# Patient Record
Sex: Male | Born: 1965 | Race: White | Hispanic: No | Marital: Married | Smoking: Never smoker
Health system: Southern US, Community
[De-identification: ages and names within clinical notes are randomized; demographics above are authoritative.]

## PROBLEM LIST (undated history)

## (undated) DIAGNOSIS — T7840XA Allergy, unspecified, initial encounter: Secondary | ICD-10-CM

## (undated) DIAGNOSIS — N200 Calculus of kidney: Secondary | ICD-10-CM

## (undated) HISTORY — PX: APPENDECTOMY: SHX54

## (undated) HISTORY — DX: Allergy, unspecified, initial encounter: T78.40XA

## (undated) HISTORY — PX: HERNIA REPAIR: SHX51

---

## 2006-01-25 ENCOUNTER — Emergency Department (HOSPITAL_COMMUNITY): Admission: EM | Admit: 2006-01-25 | Discharge: 2006-01-26 | Payer: Self-pay | Admitting: Emergency Medicine

## 2006-06-16 ENCOUNTER — Observation Stay (HOSPITAL_COMMUNITY): Admission: EM | Admit: 2006-06-16 | Discharge: 2006-06-17 | Payer: Self-pay | Admitting: Emergency Medicine

## 2006-06-16 ENCOUNTER — Encounter: Admission: RE | Admit: 2006-06-16 | Discharge: 2006-06-16 | Payer: Self-pay | Admitting: Internal Medicine

## 2006-06-16 ENCOUNTER — Encounter (INDEPENDENT_AMBULATORY_CARE_PROVIDER_SITE_OTHER): Payer: Self-pay | Admitting: Specialist

## 2007-12-22 ENCOUNTER — Emergency Department (HOSPITAL_COMMUNITY): Admission: EM | Admit: 2007-12-22 | Discharge: 2007-12-23 | Payer: Self-pay | Admitting: Emergency Medicine

## 2010-06-10 ENCOUNTER — Emergency Department (HOSPITAL_COMMUNITY): Admission: EM | Admit: 2010-06-10 | Discharge: 2010-06-11 | Payer: Self-pay | Admitting: Emergency Medicine

## 2010-11-05 LAB — URINE MICROSCOPIC-ADD ON

## 2010-11-05 LAB — URINALYSIS, ROUTINE W REFLEX MICROSCOPIC
Glucose, UA: NEGATIVE mg/dL
Ketones, ur: NEGATIVE mg/dL
Leukocytes, UA: NEGATIVE
Protein, ur: NEGATIVE mg/dL
Urobilinogen, UA: 1 mg/dL (ref 0.0–1.0)

## 2010-11-05 LAB — URINE CULTURE
Colony Count: 80000
Culture  Setup Time: 201110190810

## 2011-01-09 NOTE — Op Note (Signed)
NAMEABBOTT, JASINSKI             ACCOUNT NO.:  [id number]   MEDICAL RECORD NO.:  [id number]          PATIENT TYPE:  INP   LOCATION:  0098                         FACILITY:  [facility]   PHYSICIAN:  Ollen Gross. Vernell Morgans, M.D. DATE OF BIRTH:  07-25-1966   DATE OF PROCEDURE:  06/16/2006  DATE OF DISCHARGE:  06/17/2006                               OPERATIVE REPORT   PREOPERATIVE DIAGNOSIS:  Appendicitis.   POSTOPERATIVE DIAGNOSIS:  Appendicitis.   PROCEDURE:  Laparoscopic appendectomy.   SURGEON:  Ollen Gross. Carolynne Edouard, M.D.   ANESTHESIA:  General endotracheal.   PROCEDURE:  After informed consent was obtained, the patient was brought  to the operating room and placed in a supine position on the operating  room table.  After adequate induction of general anesthesia, the  patient's abdomen was prepped with Betadine, draped in the usual sterile  manner.  The area below the umbilicus was infiltrated with 0.25%  Marcaine.  A small incision was made with a 15 blade knife.  This  incision was carried down through the subcutaneous tissue bluntly with a  hemostat and [employer] retractors until the linea alba was identified.  The linea alba was incised with a 15 blade knife, and each side was  grasped with Kocher clamps and elevated anteriorly.  The pre-peritoneal  space was then probed bluntly with a hemostat until the peritoneum was  opened and access was gained through the abdominal cavity.  A 0 Vicryl  purse-string stitch was placed on the fascia surrounding the opening.  A  Hasson cannula was placed through the opening __________ previously  placed Vicryl purse-string stitch.  The abdomen was then insufflated  with carbon dioxide without difficulty.  The patient was placed in  Trendelenburg position and rotated slightly right-side up.  The  laparoscope was inserted through the Hasson cannula, and the right lower  quadrant was inspected.  The enlarged, inflamed appendix was easily  identified.  No  other abnormalities were noted on inspecting the  abdomen.  Next, the suprapubic area was infiltrated with 0.25% Marcaine.  A small incision was made with a 15 blade knife and a 10 mm port was  placed bluntly through this incision into the abdominal cavity under  direct vision.  The laparoscope was then moved to the suprapubic porte.  Between the two ports, a site was chosen for a 5 mm port.  This area was  infiltrated with 0.25% Marcaine.  A small stab incision was made with a  15 blade knife, and a 5 mm port was placed bluntly through this incision  into the abdominal cavity under direct vision.  Using a Glassman grasper  and a harmonic scalpel, the appendix was identified, and it was  mobilized by incising some of its retroperitoneal attachments sharply  with the harmonic scalpel.  Once the appendix was able to be elevated,  the mesoappendix was taken down sharply with the harmonic scalpel  without difficulty.  The base of the appendix at its juncture with the  cecum was identified and cleared of any tissue debris.  A laparoscopic  60B stapler with the blue  load was then placed through the Hasson  cannula  and across the base of the appendix, clamped and fired,  __________, dividing the base of the appendix between staple lines.  The  laparoscopic bag was inserted through the Hasson cannula, and the  appendix was placed into the bag, and the bag was sealed.  The abdomen  was then irrigated with copious amounts of saline until the effluent was  clear.  The staple line was examined and found to be completely  hemostatic and healthy.  The appendix in the bag was then removed with  the Hasson cannula through the infraumbilical port without difficulty.  The fascial defect was closed with the previously placed Vicryl purse-  string stitch as well as with another figure-of-eight 0 Vicryl stitch.  The rest of the ports were removed under direct vision.  The gas was  allowed to escape.  The  fascia of the suprapubic port was also closed  under direct vision with interrupted 0 Vicryl stitch.  The skin was all  closed with interrupted 4-0  Monocryl subcuticular stitches.  Benzoin, Steri-Strips, and sterile  dressings were applied.  The patient tolerated the procedure well.  At  the end of the case, all needle, sponge, instrument counts were correct.  Patient was then awakened and taken to recovery in stable condition.      Ollen Gross. Vernell Morgans, M.D.  Electronically Signed     PST/MEDQ  D:  06/17/2006  T:  06/18/2006  Job:  [id number]

## 2011-01-09 NOTE — H&P (Signed)
NAMEIRIS, TATSCH             ACCOUNT NO.:  [id number]   MEDICAL RECORD NO.:  [id number]          PATIENT TYPE:  INP   LOCATION:  0098                         FACILITY:  [facility]   PHYSICIAN:  Ollen Gross. Vernell Morgans, M.D. DATE OF BIRTH:  1966/01/21   DATE OF ADMISSION:  06/16/2006  DATE OF DISCHARGE:  06/17/2006                              HISTORY & PHYSICAL   Mr. Crumby is a 45 year old white male who presents today to the  emergency department with right lower quadrant pain that started  yesterday.  He has had some fevers and chills with this.  He has had no  nausea and vomiting.  No chest pain or shortness of breath.  The pain  started diffusely and moved to his right lower quadrant.  His other  review of systems are unremarkable.   PAST MEDICAL HISTORY:  Significant for kidney stones.   PAST SURGICAL HISTORY:  Significant for kidney stone removal and a cyst  removed from his mouth as a child.   MEDICATIONS:  None.   ALLERGIES:  No known drug allergies.   SOCIAL HISTORY:  He denies the use of alcohol or tobacco products.   FAMILY HISTORY:  Noncontributory.   PHYSICAL EXAMINATION:  VITAL SIGNS:  Temperature 97.2, blood pressure 154/99, pulse 82.  GENERAL:  Well developed, well nourished white male in no acute  distress.  SKIN:  Warm and dry, no jaundice.  HEENT:  Extraocular movements intact, pupils equal, round, reactive to  light, sclerae nonicteric.  LUNGS:  Clear bilaterally with no use of accessory respiratory muscles.  HEART:  Regular rate and rhythm with an impulse in the left chest.  ABDOMEN:  Soft with focal right lower quadrant tenderness with some  guarding but no peritonitis.  EXTREMITIES:  No cyanosis, clubbing, and edema with good strength in his  arms and legs.  PSYCHOLOGICAL:  Alert and oriented x 3 with no evidence of anxiety or  depression.   LABORATORY DATA:  On review of his lab work, it was significant for a  normal white count.  On reviewing his CT  scan with the radiologist, this  did show an enlarged inflamed appendix.   ASSESSMENT/PLAN:  This is a 45 year old white male with what appears to  be acute appendicitis.  Because of the risk of rupture and sepsis, I  think he needs to have his appendix removed today.  I have discussed  with him in  detail the risks and benefits of the operation to remove the appendix as  well as some of the technical aspects and he understands and wishes to  proceed.  We will obtain some routine preoperative lab work in  preparation in doing this for him this evening.      Ollen Gross. Vernell Morgans, M.D.  Electronically Signed     PST/MEDQ  D:  06/16/2006  T:  06/17/2006  Job:  [id number]

## 2011-04-27 ENCOUNTER — Emergency Department (HOSPITAL_COMMUNITY): Payer: BC Managed Care – PPO

## 2011-04-27 ENCOUNTER — Emergency Department (HOSPITAL_COMMUNITY)
Admission: EM | Admit: 2011-04-27 | Discharge: 2011-04-27 | Disposition: A | Discharge status: Home / Self Care | Payer: BC Managed Care – PPO | Attending: Emergency Medicine | Admitting: Emergency Medicine

## 2011-04-27 DIAGNOSIS — R109 Unspecified abdominal pain: Secondary | ICD-10-CM | POA: Insufficient documentation

## 2011-04-27 DIAGNOSIS — R112 Nausea with vomiting, unspecified: Secondary | ICD-10-CM | POA: Insufficient documentation

## 2011-04-27 DIAGNOSIS — Z87442 Personal history of urinary calculi: Secondary | ICD-10-CM | POA: Insufficient documentation

## 2011-04-27 DIAGNOSIS — N200 Calculus of kidney: Secondary | ICD-10-CM | POA: Insufficient documentation

## 2011-04-27 LAB — URINALYSIS, ROUTINE W REFLEX MICROSCOPIC
Leukocytes, UA: NEGATIVE
Nitrite: NEGATIVE
Specific Gravity, Urine: 1.021 (ref 1.005–1.030)
Urobilinogen, UA: 0.2 mg/dL (ref 0.0–1.0)
pH: 6 (ref 5.0–8.0)

## 2011-04-27 LAB — URINE MICROSCOPIC-ADD ON

## 2011-04-27 LAB — CBC
MCHC: 34.6 g/dL (ref 30.0–36.0)
RDW: 12.8 % (ref 11.5–15.5)
WBC: 11.7 10*3/uL — ABNORMAL HIGH (ref 4.0–10.5)

## 2011-04-27 LAB — DIFFERENTIAL
Basophils Absolute: 0 10*3/uL (ref 0.0–0.1)
Basophils Relative: 0 % (ref 0–1)
Eosinophils Relative: 0 % (ref 0–5)
Lymphocytes Relative: 9 % — ABNORMAL LOW (ref 12–46)
Monocytes Absolute: 0.9 10*3/uL (ref 0.1–1.0)
Neutro Abs: 9.6 10*3/uL — ABNORMAL HIGH (ref 1.7–7.7)

## 2011-04-27 LAB — BASIC METABOLIC PANEL
Chloride: 100 mEq/L (ref 96–112)
GFR calc Af Amer: >60 mL/min (ref >60)
GFR calc non Af Amer: >60 mL/min (ref >60)
Potassium: 4 mEq/L (ref 3.5–5.1)
Sodium: 138 mEq/L (ref 135–145)

## 2012-03-23 IMAGING — CT CT ABD-PELV W/O CM
1 of 2 series · 15 of 32 positions shown, 19 images · non-contrast
Comparison: 06/11/2010

CLINICAL DATA: Right flank pain.  Nausea.  History of stones.
History of appendectomy.

CT ABDOMEN AND PELVIS WITHOUT CONTRAST
TECHNIQUE: Multidetector CT imaging of the abdomen and pelvis was
performed following the standard protocol without intravenous
contrast.

[Series 2: abd/pel w/o · axial · non-contrast · 0.77mm/px · z∈[+836,+1166]mm · 15 of 75 slices shown, 19 images]
[im 6/75  soft-tissue]
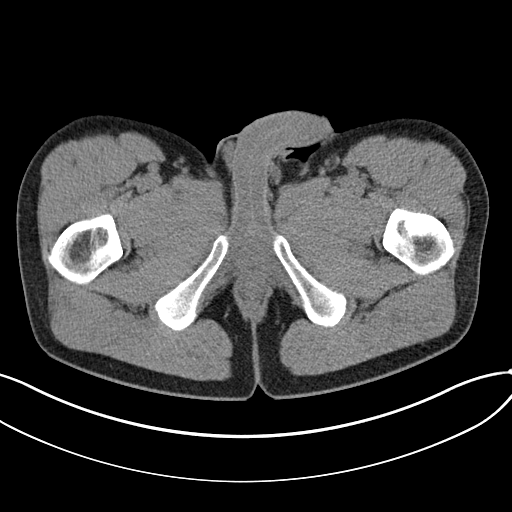
[im 6/75  bone]
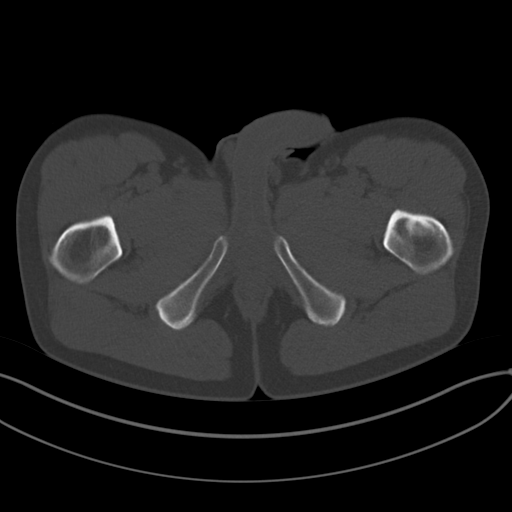
[im 11/75  soft-tissue]
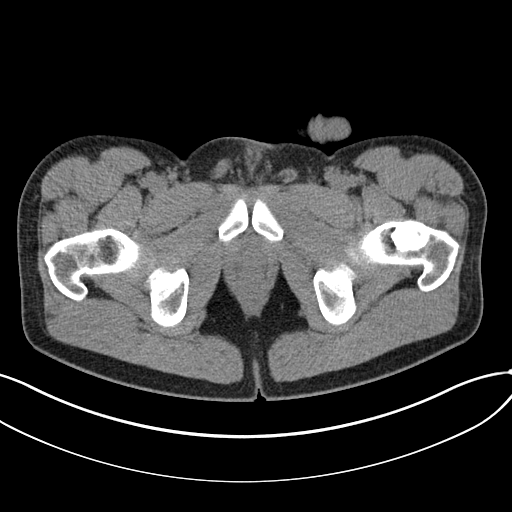
[im 17/75  soft-tissue]
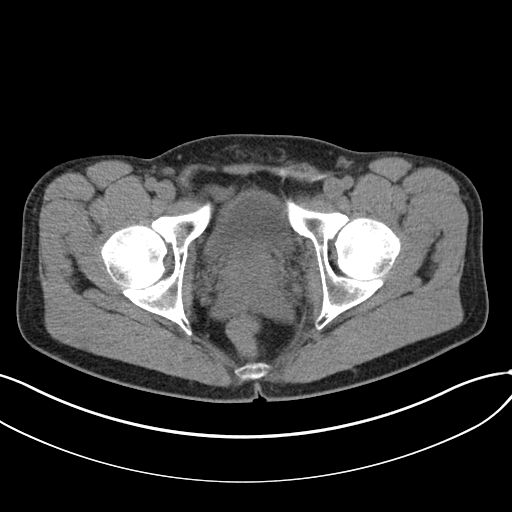
[im 22/75  soft-tissue]
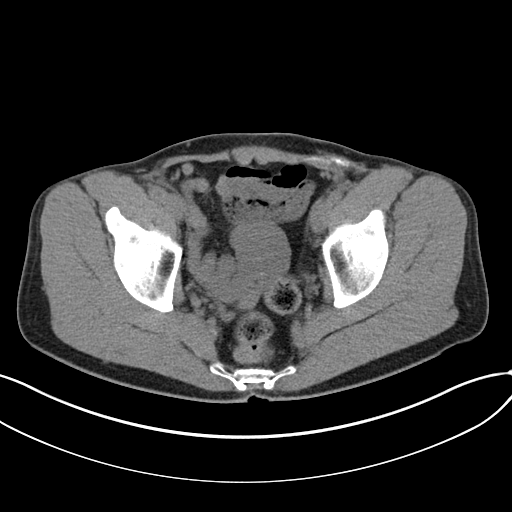
[im 28/75  soft-tissue]
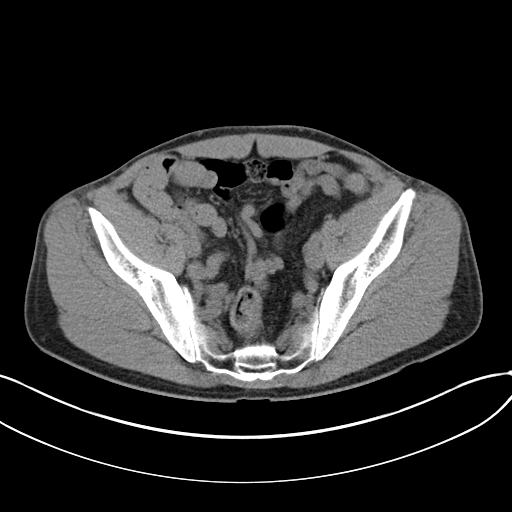
[im 33/75  soft-tissue]
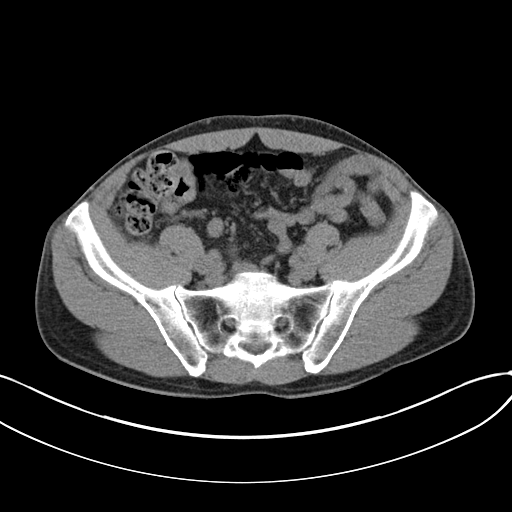
[im 39/75  soft-tissue]
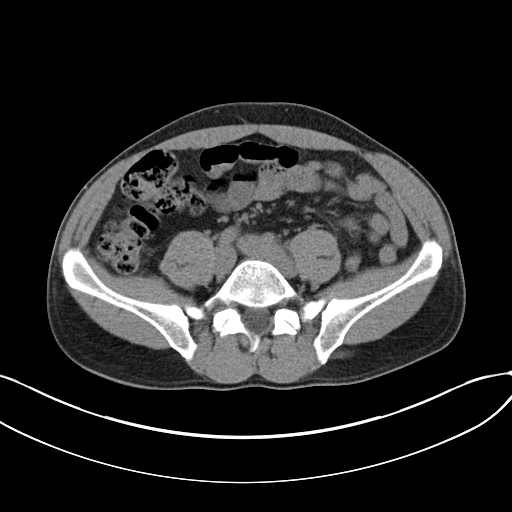
[im 44/75  soft-tissue]
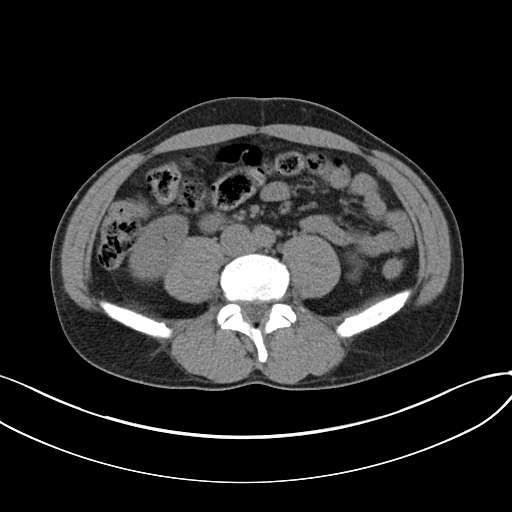
[im 50/75  soft-tissue]
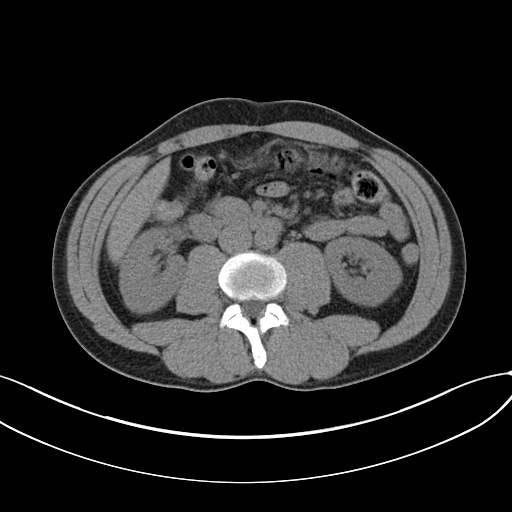
[im 50/75  bone]
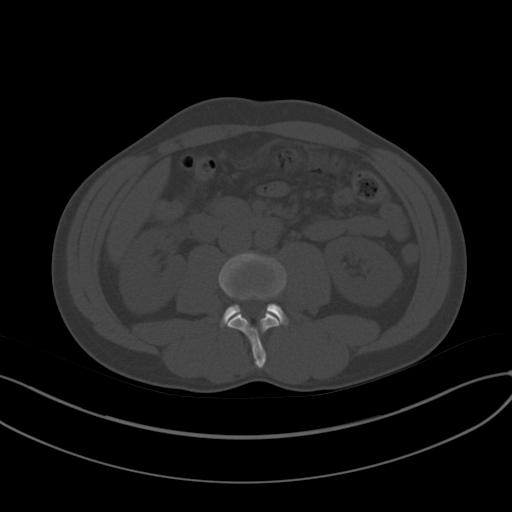
[im 55/75  soft-tissue]
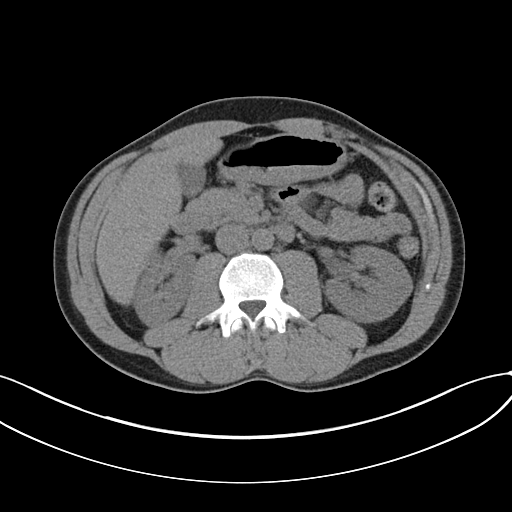
[im 61/75  soft-tissue]
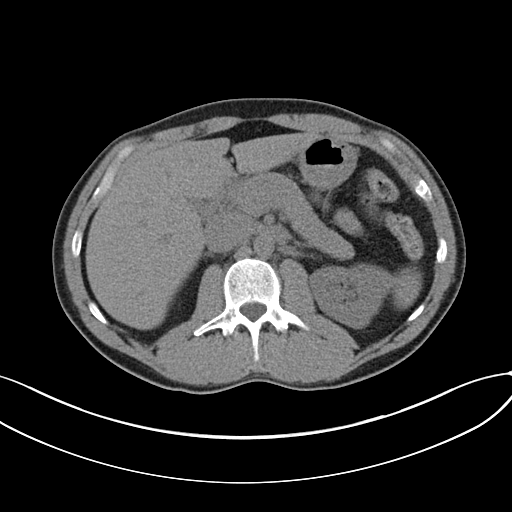
[im 64/75  lung]
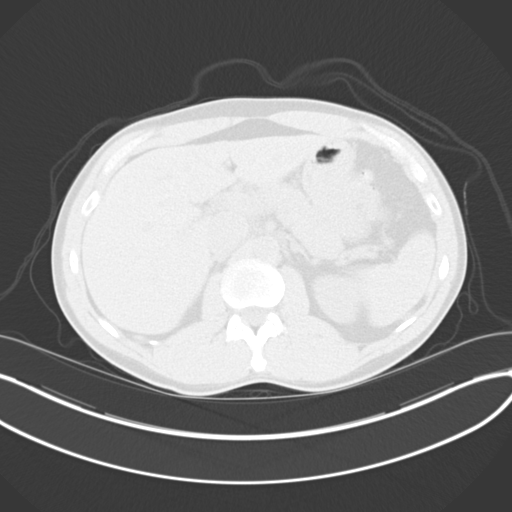
[im 66/75  soft-tissue]
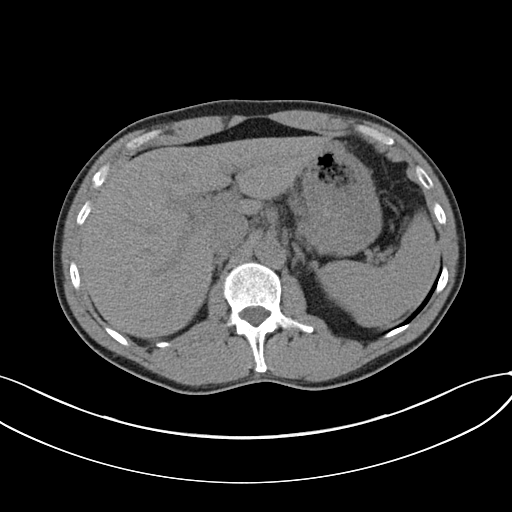
[im 66/75  lung]
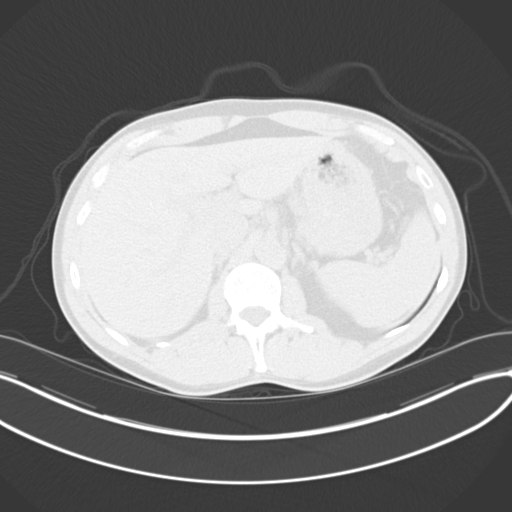
[im 69/75  lung]
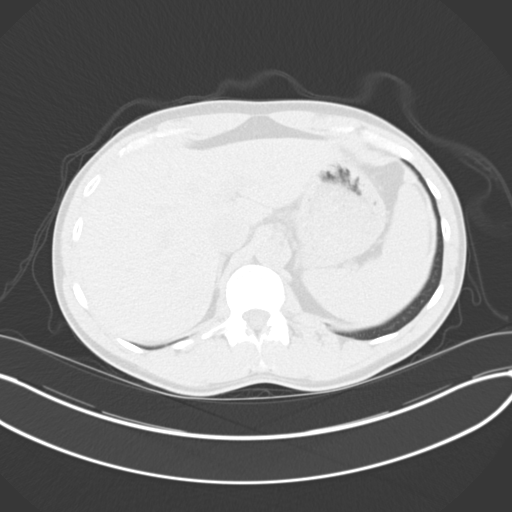
[im 72/75  soft-tissue]
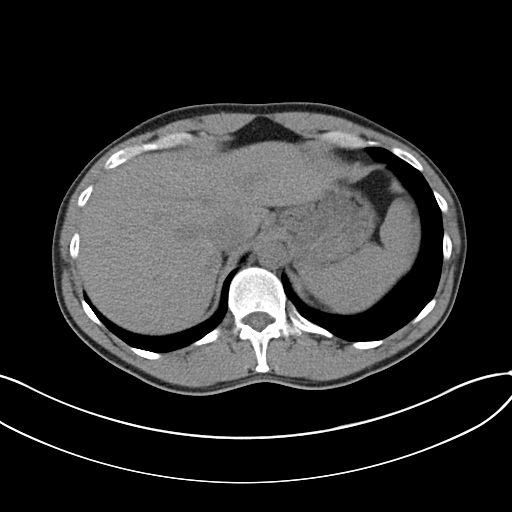
[im 72/75  lung]
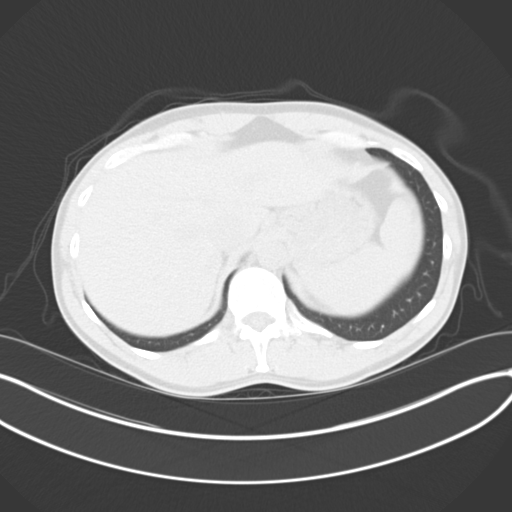

[15 of 32 positions shown; findings below may reference images not displayed]

FINDINGS: Lung bases are unremarkable.  There are intrarenal
calculi on the right, measuring one - 4 mm in diameter.  No
evidence for ureteral stones however.  No intrarenal or ureteral
stones are identified in the left kidney.  No focal abnormality
identified within the visualized portions of the liver, spleen,
pancreas, adrenal glands.  The gallbladder is present.  There are
scattered colonic diverticula.  No CT evidence for acute
diverticulitis.  Patient has had prior appendectomy.  No evidence
for bowel obstruction or bowel wall thickening.

The urinary bladder and distal urinary tract have a normal
appearance.  No free pelvic fluid or pelvic adenopathy. No evidence
for aortic aneurysm. There is stable sclerosis within the left
femoral neck, consistent with benign process.
IMPRESSION: 1.  Multiple intrarenal calculi on the right.
2.  No evidence for ureteral calculi or other ureteral obstruction.
3.  Diverticulosis without evidence for acute diverticulitis.

## 2013-01-06 ENCOUNTER — Ambulatory Visit (INDEPENDENT_AMBULATORY_CARE_PROVIDER_SITE_OTHER): Payer: BC Managed Care – PPO | Admitting: Emergency Medicine

## 2013-01-06 VITALS — BP 140/90 | HR 62 | Temp 98.7°F | Resp 16 | Ht 67.0 in | Wt 155.0 lb

## 2013-01-06 DIAGNOSIS — J020 Streptococcal pharyngitis: Secondary | ICD-10-CM

## 2013-01-06 DIAGNOSIS — A088 Other specified intestinal infections: Secondary | ICD-10-CM

## 2013-01-06 DIAGNOSIS — J029 Acute pharyngitis, unspecified: Secondary | ICD-10-CM

## 2013-01-06 DIAGNOSIS — R197 Diarrhea, unspecified: Secondary | ICD-10-CM

## 2013-01-06 MED ORDER — CIPROFLOXACIN HCL 500 MG PO TABS: 500.0000 mg | ORAL_TABLET | Freq: Two times a day (BID) | ORAL | Status: DC

## 2013-01-06 MED ORDER — ONDANSETRON 8 MG PO TBDP: 8.0000 mg | ORAL_TABLET | Freq: Three times a day (TID) | ORAL | Status: DC | PRN

## 2013-01-06 NOTE — Progress Notes (Addendum)
[facility] [address] [place] [address] [phone]  Date:  01/06/2013   Name:  Christian Rivas   DOB:  19-Jan-1966   MRN:  [id number]  PCP:  Jacklynn Barnacle, NP    Chief Complaint: Nausea and Diarrhea   History of Present Illness:  Christian Rivas is a 47 y.o. very pleasant male patient who presents with the following:  Traveled to [place] for 10 days and returned home a week ago. Did not follow advice to avoid drinking local water and had diarrhea, nausea and vomiting.  Moderate abdominal cramping.  Symptoms passed and was able to eat solid food until Wednesday when he again developed diarrhea.  Says two of his travelling companions were treated for "parasitosis".  No fever or chills.  The patient has no complaint of blood, mucous, or pus in her stools. Has a sore throat and clear nasal drainage started today. No improvement with over the counter medications or other home remedies. Denies other complaint or health concern today.   There are no active problems to display for this patient.   Past Medical History  Diagnosis Date  . Allergy     Past Surgical History  Procedure Laterality Date  . Appendectomy    . Hernia repair      History  Substance Use Topics  . Smoking status: Never Smoker   . Smokeless tobacco: Not on file  . Alcohol Use: Yes    History reviewed. No pertinent family history.  No Known Allergies  Medication list has been reviewed and updated.  No current outpatient prescriptions on file prior to visit.   No current facility-administered medications on file prior to visit.    Review of Systems:  As per HPI, otherwise negative.      Physical Examination: Filed Vitals:   01/06/13 1015  BP: 140/90  Pulse: 62  Temp: 98.7 F (37.1 C)  Resp: 16   Filed Vitals:   01/06/13 1015  Height: 5\' 7"  (1.702 m)  Weight: 155 lb (70.308 kg)   Body mass index is 24.27 kg/(m^2). Ideal Body Weight: Weight in (lb) to  have BMI = 25: 159.3  GEN: WDWN, NAD, Non-toxic, A & O x 3 HEENT: Atraumatic, Normocephalic. Neck supple. No masses, No LAD. Ears and Nose: No external deformity. CV: RRR, No M/G/R. No JVD. No thrill. No extra heart sounds. PULM: CTA B, no wheezes, crackles, rhonchi. No retractions. No resp. distress. No accessory muscle use. ABD: S, NT, ND, +BS. No rebound. No HSM. EXTR: No c/c/e NEURO Normal gait.  PSYCH: Normally interactive. Conversant. Not depressed or anxious appearing.  Calm demeanor.    Assessment and Plan: "travellers diarrhea" vs giardia Pharyngitis Labs   Signed,  Phillips Odor, MD   Results for orders placed in visit on 01/06/13  POCT RAPID STREP A (OFFICE)      Result Value Range   Rapid Strep A Screen Negative  Negative

## 2013-01-06 NOTE — Patient Instructions (Addendum)
Clear Liquid Diet The clear liquid dietconsists of foods that are liquid or will become liquid at room temperature.You should be able to see through the liquid and beverages. Examples of foods allowed on a clear liquid diet include fruit juice, broth or bouillon, gelatin, or frozen ice pops. The purpose of this diet is to provide necessary fluid, electrolytes such as sodium and potassium, and energy to keep the body functioning during times when you are not able to consume a regular diet.A clear liquid diet should not be continued for long periods of time as it is not nutritionally adequate.  REASONS FOR USING A CLEAR LIQUID DIET  In sudden onset (acute) conditions for a patient before or after surgery.  As the first step in oral feeding.  For fluid and electrolyte replacement in diarrheal diseases.  As a diet before certain medical tests are performed. ADEQUACY The clear liquid diet is adequate only in ascorbic acid, according to the Recommended Dietary Allowances of the [employer]. CHOOSING FOODS Breads and Starches  Allowed:  None are allowed.  Avoid: All are avoided. Vegetables  Allowed:  Strained tomato or vegetable juice.  Avoid: Any others. Fruit  Allowed:  Strained fruit juices and fruit drinks. Include 1 serving of citrus or vitamin C-enriched fruit juice daily.  Avoid: Any others. Meat and Meat Substitutes  Allowed:  None are allowed.  Avoid: All are avoided. Milk  Allowed:  None are allowed.  Avoid: All are avoided. Soups and Combination Foods  Allowed:  Clear bouillon, broth, or strained broth-based soups.  Avoid: Any others. Desserts and Sweets  Allowed:  Sugar, honey. High protein gelatin. Flavored gelatin, ices, or frozen ice pops that do not contain milk.  Avoid: Any others. Fats and Oils  Allowed:  None are allowed.  Avoid: All are avoided. Beverages  Allowed: Cereal beverages, coffee (regular or decaffeinated), tea, or soda  at the discretion of your caregiver.  Avoid: Any others. Condiments  Allowed:  Iodized salt.  Avoid: Any others, including pepper. Supplements  Allowed:  Liquid nutrition beverages.  Avoid: Any others that contain lactose or fiber. SAMPLE MEAL PLAN Breakfast  4 oz (120 mL) strained orange juice.   to 1 cup (125 to 250 mL) gelatin (plain or fortified).  1 cup (250 mL) beverage (coffee or tea).  Sugar, if desired. Midmorning Snack   cup (125 mL) gelatin (plain or fortified). Lunch  1 cup (250 mL) broth or consomm.  4 oz (120 mL) strained grapefruit juice.   cup (125 mL) gelatin (plain or fortified).  1 cup (250 mL) beverage (coffee or tea).  Sugar, if desired. Midafternoon Snack   cup (125 mL) fruit ice.   cup (125 mL) strained fruit juice. Dinner  1 cup (250 mL) broth or consomm.   cup (125 mL) cranberry juice.   cup (125 mL) flavored gelatin (plain or fortified).  1 cup (250 mL) beverage (coffee or tea).  Sugar, if desired. Evening Snack  4 oz (120 mL) strained apple juice (vitamin C-fortified).   cup (125 mL) flavored gelatin (plain or fortified). Document Released: 08/10/2005 Document Revised: 11/02/2011 Document Reviewed: 11/07/2010 [facility] Patient Information 2013 [place], [place]. Giardiasis Giardiasis is an infection of the small intestine with the parasite Giardia intestinalis. Giardia intestinalis cannot be seen with the naked eye. It is often found in unclean (contaminated) water.  CAUSES  Infection can be caused by drinking contaminated water. Giardia intestinalis can also be found in some tap water. SYMPTOMS  An infection  causes:  Explosive, foul smelling, watery diarrhea.  A Feeling of sickness in your stomach (nausea).  Abdominal cramps and pain. It takes about 1 to 2 weeks after ingesting infected water or food to get sick. The illness usually lasts 2 to 4 weeks. Infection in infants and children can be long  lasting. DIAGNOSIS  It can be diagnosed by stool exam. Blood tests may be needed. TREATMENT  Medications can be given to shorten the course of the illness. HOME CARE INSTRUCTIONS   In areas of contamination, boil your water if possible. Filtering tap water in areas of contamination removes most Giardia. Cysts of Giardia Intestinalis are resistant to chlorine.  Be careful handling soiled undergarments and diapers. If infection is present, it is easily passed by hand to mouth. Use good hand-washing techniques.  Follow up with your caregiver as directed. SEEK MEDICAL CARE IF:  You do not get better. Document Released: 08/07/2000 Document Revised: 11/02/2011 Document Reviewed: 03/29/2008 [facility] Patient Information 2013 [place], [place]. Diarrhea Infections caused by germs (bacterial) or a virus commonly cause diarrhea. Your caregiver has determined that with time, rest and fluids, the diarrhea should improve. In general, eat normally while drinking more water than usual. Although water may prevent dehydration, it does not contain salt and minerals (electrolytes). Broths, weak tea without caffeine and oral rehydration solutions (ORS) replace fluids and electrolytes. Small amounts of fluids should be taken frequently. Large amounts at one time may not be tolerated. Plain water may be harmful in infants and the elderly. Oral rehydrating solutions (ORS) are available at pharmacies and grocery stores. ORS replace water and important electrolytes in proper proportions. Sports drinks are not as effective as ORS and may be harmful due to sugars worsening diarrhea.  ORS is especially recommended for use in children with diarrhea. As a general guideline for children, replace any new fluid losses from diarrhea and/or vomiting with ORS as follows:  If your child weighs 22 pounds or under (10 kg or less), give 60-120 mL ( -  cup or 2 - 4 ounces) of ORS for each episode of diarrheal stool or vomiting  episode.  If your child weighs more than 22 pounds (more than 10 kgs), give 120-240 mL ( - 1 cup or 4 - 8 ounces) of ORS for each diarrheal stool or episode of vomiting.  While correcting for dehydration, children should eat normally. However, foods high in sugar should be avoided because this may worsen diarrhea. Large amounts of carbonated soft drinks, juice, gelatin desserts and other highly sugared drinks should be avoided.  After correction of dehydration, other liquids that are appealing to the child may be added. Children should drink small amounts of fluids frequently and fluids should be increased as tolerated. Children should drink enough fluids to keep urine clear or pale yellow.  Adults should eat normally while drinking more fluids than usual. Drink small amounts of fluids frequently and increase as tolerated. Drink enough fluids to keep urine clear or pale yellow. Broths, weak decaffeinated tea, lemon lime soft drinks (allowed to go flat) and ORS replace fluids and electrolytes.  Avoid:  Carbonated drinks.  Juice.  Extremely hot or cold fluids.  Caffeine drinks.  Fatty, greasy foods.  Alcohol.  Tobacco.  Too much intake of anything at one time.  Gelatin desserts.  Probiotics are active cultures of beneficial bacteria. They may lessen the amount and number of diarrheal stools in adults. Probiotics can be found in yogurt with active cultures and in supplements.  Wash hands well to avoid spreading bacteria and virus.  Anti-diarrheal medications are not recommended for infants and children.  Only take over-the-counter or prescription medicines for pain, discomfort or fever as directed by your caregiver. Do not give aspirin to children because it may cause Reye's Syndrome.  For adults, ask your caregiver if you should continue all prescribed and over-the-counter medicines.  If your caregiver has given you a follow-up appointment, it is very important to keep that  appointment. Not keeping the appointment could result in a chronic or permanent injury, and disability. If there is any problem keeping the appointment, you must call back to this facility for assistance. SEEK IMMEDIATE MEDICAL CARE IF:   You or your child is unable to keep fluids down or other symptoms or problems become worse in spite of treatment.  Vomiting or diarrhea develops and becomes persistent.  There is vomiting of blood or bile (green material).  There is blood in the stool or the stools are black and tarry.  There is no urine output in 6-8 hours or there is only a small amount of very dark urine.  Abdominal pain develops, increases or localizes.  You have a fever.  Your baby is older than 3 months with a rectal temperature of 102 F (38.9 C) or higher.  Your baby is 63 months old or younger with a rectal temperature of 100.4 F (38 C) or higher.  You or your child develops excessive weakness, dizziness, fainting or extreme thirst.  You or your child develops a rash, stiff neck, severe headache or become irritable or sleepy and difficult to awaken. MAKE SURE YOU:   Understand these instructions.  Will watch your condition.  Will get help right away if you are not doing well or get worse. Document Released: 07/31/2002 Document Revised: 11/02/2011 Document Reviewed: 06/17/2009 [facility] Patient Information 2013 [place], [place].

## 2013-01-11 LAB — OVA AND PARASITE SCREEN: OP: NONE SEEN

## 2013-01-13 LAB — STOOL CULTURE

## 2017-05-17 ENCOUNTER — Emergency Department (HOSPITAL_COMMUNITY): Admission: EM | Admit: 2017-05-17 | Discharge: 2017-05-17 | Discharge status: Against Medical Advice | Payer: Self-pay

## 2017-05-17 ENCOUNTER — Emergency Department (HOSPITAL_BASED_OUTPATIENT_CLINIC_OR_DEPARTMENT_OTHER): Payer: 59

## 2017-05-17 ENCOUNTER — Encounter (HOSPITAL_BASED_OUTPATIENT_CLINIC_OR_DEPARTMENT_OTHER): Payer: Self-pay | Admitting: Emergency Medicine

## 2017-05-17 ENCOUNTER — Emergency Department (HOSPITAL_BASED_OUTPATIENT_CLINIC_OR_DEPARTMENT_OTHER)
Admission: EM | Admit: 2017-05-17 | Discharge: 2017-05-17 | Disposition: A | Discharge status: Home / Self Care | Payer: 59 | Attending: Emergency Medicine | Admitting: Emergency Medicine

## 2017-05-17 DIAGNOSIS — R1031 Right lower quadrant pain: Secondary | ICD-10-CM | POA: Diagnosis present

## 2017-05-17 DIAGNOSIS — N23 Unspecified renal colic: Secondary | ICD-10-CM | POA: Diagnosis not present

## 2017-05-17 DIAGNOSIS — N201 Calculus of ureter: Secondary | ICD-10-CM | POA: Diagnosis not present

## 2017-05-17 DIAGNOSIS — R109 Unspecified abdominal pain: Secondary | ICD-10-CM

## 2017-05-17 HISTORY — DX: Calculus of kidney: N20.0

## 2017-05-17 LAB — URINALYSIS, MICROSCOPIC (REFLEX): Squamous Epithelial / LPF: NONE SEEN

## 2017-05-17 LAB — URINALYSIS, ROUTINE W REFLEX MICROSCOPIC
Bilirubin Urine: NEGATIVE
Glucose, UA: NEGATIVE mg/dL
KETONES UR: NEGATIVE mg/dL
LEUKOCYTES UA: NEGATIVE
Nitrite: NEGATIVE
PH: 7.5 (ref 5.0–8.0)
Protein, ur: 30 mg/dL — AB
SPECIFIC GRAVITY, URINE: 1.01 (ref 1.005–1.030)

## 2017-05-17 MED ORDER — HYDROMORPHONE HCL 1 MG/ML IJ SOLN
1.0000 mg | Freq: Once | INTRAMUSCULAR | Status: AC
  Administered 2017-05-17: 1 mg via INTRAVENOUS
  Filled 2017-05-17: qty 1

## 2017-05-17 MED ORDER — ONDANSETRON HCL 4 MG/2ML IJ SOLN
4.0000 mg | Freq: Once | INTRAMUSCULAR | Status: AC
  Administered 2017-05-17: 4 mg via INTRAVENOUS
  Filled 2017-05-17: qty 2

## 2017-05-17 MED ORDER — OXYCODONE-ACETAMINOPHEN 5-325 MG PO TABS
1.0000 | ORAL_TABLET | ORAL | Status: DC | PRN
  Administered 2017-05-17: 1 via ORAL
  Filled 2017-05-17: qty 1

## 2017-05-17 MED ORDER — KETOROLAC TROMETHAMINE 30 MG/ML IJ SOLN
30.0000 mg | Freq: Once | INTRAMUSCULAR | Status: AC
  Administered 2017-05-17: 30 mg via INTRAVENOUS
  Filled 2017-05-17: qty 1

## 2017-05-17 MED ORDER — PROMETHAZINE HCL 25 MG PO TABS: 25.0000 mg | ORAL_TABLET | Freq: Three times a day (TID) | ORAL | 0 refills | Status: DC | PRN

## 2017-05-17 MED ORDER — OXYCODONE-ACETAMINOPHEN 5-325 MG PO TABS: 1.0000 | ORAL_TABLET | ORAL | 0 refills | Status: DC | PRN

## 2017-05-17 NOTE — ED Notes (Signed)
Pt. Reports he is feeling better and ready to go home if possible

## 2017-05-17 NOTE — ED Notes (Signed)
LEFT WITHOUT BEING SEEN.

## 2017-05-17 NOTE — Discharge Instructions (Signed)
Return here as needed.  Follow-up with [facility]

## 2017-05-17 NOTE — ED Triage Notes (Signed)
Right sided flank pain since 1130 am.  Hx of kidney stones.  Pt took ibuprofen and home and his last flomax that he had.  Pt states pain has been waxing and waning from 10 down to 7.

## 2017-05-17 NOTE — ED Notes (Signed)
ED Provider at bedside. 

## 2017-05-21 NOTE — ED Provider Notes (Signed)
[facility]-EMERGENCY DEPT Provider Note   CSN: [id number] Arrival date & time: 05/17/17  1751     History   Chief Complaint Chief Complaint  Patient presents with  . Flank Pain    HPI Christian Rivas is a 51 y.o. male.  HPI Patient presents to the emergency department with right-sided flank pain that started this morning around 11:30.  The patient states he does have a history of kidney stones.  He states he took 3 ibuprofen with some relief of his symptoms, but states the pain came back pretty significantly, and that is what brought him to the emergency department.  The patient states that this feels similar to previous episodes of kidney stoneThe patient denies chest pain, shortness of breath, headache,blurred vision, neck pain, fever, cough, weakness, numbness, dizziness, anorexia, edema,  vomiting, diarrhea, rash, back pain, dysuria, hematemesis, bloody stool, near syncope, or syncope. Past Medical History:  Diagnosis Date  . Allergy   . Kidney stones     There are no active problems to display for this patient.   Past Surgical History:  Procedure Laterality Date  . APPENDECTOMY    . HERNIA REPAIR         Home Medications    Prior to Admission medications   Medication Sig Start Date End Date Taking? Authorizing Provider  oxyCODONE-acetaminophen (PERCOCET/ROXICET) 5-325 MG tablet Take 1-2 tablets by mouth every 4 (four) hours as needed for severe pain. 05/17/17   Stephene Alegria, Cristal [place], PA-C  promethazine (PHENERGAN) 25 MG tablet Take 1 tablet (25 mg total) by mouth every 8 (eight) hours as needed for nausea or vomiting. 05/17/17   Charlestine Night, PA-C    Family History No family history on file.  Social History Social History  Substance Use Topics  . Smoking status: Never Smoker  . Smokeless tobacco: Never Used  . Alcohol use Yes     Allergies   Patient has no known allergies.   Review of Systems Review of Systems All other systems negative except as  documented in the HPI. All pertinent positives and negatives as reviewed in the HPI. Physical Exam Updated Vital Signs BP 127/79 (BP Location: Left Arm)   Pulse 65   Temp 98.2 F (36.8 C) (Oral)   Resp 16   SpO2 98%   Physical Exam  Constitutional: He is oriented to person, place, and time. He appears well-developed and well-nourished. No distress.  HENT:  Head: Normocephalic and atraumatic.  Mouth/Throat: Oropharynx is clear and moist.  Eyes: Pupils are equal, round, and reactive to light.  Neck: Normal range of motion. Neck supple.  Cardiovascular: Normal rate, regular rhythm and normal heart sounds.  Exam reveals no gallop and no friction rub.   No murmur heard. Pulmonary/Chest: Effort normal and breath sounds normal. No respiratory distress. He has no wheezes.  Abdominal: Soft. Bowel sounds are normal. He exhibits no distension and no mass. There is no tenderness. There is no rebound and no guarding.  Neurological: He is alert and oriented to person, place, and time. He exhibits normal muscle tone. Coordination normal.  Skin: Skin is warm and dry. Capillary refill takes less than 2 seconds. No rash noted. No erythema.  Psychiatric: He has a normal mood and affect. His behavior is normal.  Nursing note and vitals reviewed.    ED Treatments / Results  Labs (all labs ordered are listed, but only abnormal results are displayed) Labs Reviewed  URINALYSIS, ROUTINE W REFLEX MICROSCOPIC - Abnormal; Notable for the following:  Result Value   APPearance HAZY (*)    Hgb urine dipstick LARGE (*)    Protein, ur 30 (*)    All other components within normal limits  URINALYSIS, MICROSCOPIC (REFLEX) - Abnormal; Notable for the following:    Bacteria, UA FEW (*)    All other components within normal limits    EKG  EKG Interpretation None       Radiology No results found.  Procedures Procedures (including critical care time)  Medications Ordered in ED Medications    HYDROmorphone (DILAUDID) injection 1 mg (1 mg Intravenous Given 05/17/17 2026)  ondansetron (ZOFRAN) injection 4 mg (4 mg Intravenous Given 05/17/17 2023)  ketorolac (TORADOL) 30 MG/ML injection 30 mg (30 mg Intravenous Given 05/17/17 2226)     Initial Impression / Assessment and Plan / ED Course  I have reviewed the triage vital signs and the nursing notes.  Pertinent labs & imaging results that were available during my care of the patient were reviewed by me and considered in my medical decision making (see chart for details).    The patient has been treated for a ureteral stone.  I did not advise him to follow-up with urology.  He is given pain medications and is feeling no symptoms at this time.  I told him to return for any worsening in his condition.  Patient agrees the plan and all questions were answered  Final Clinical Impressions(s) / ED Diagnoses   Final diagnoses:  Ureterolithiasis  Ureteral colic  Flank pain    New Prescriptions Discharge Medication List as of 05/17/2017 11:14 PM    START taking these medications   Details  oxyCODONE-acetaminophen (PERCOCET/ROXICET) 5-325 MG tablet Take 1-2 tablets by mouth every 4 (four) hours as needed for severe pain., Starting Mon 05/17/2017, Print    promethazine (PHENERGAN) 25 MG tablet Take 1 tablet (25 mg total) by mouth every 8 (eight) hours as needed for nausea or vomiting., Starting Mon 05/17/2017, Print         Rhian Funari, [place], PA-C 05/21/17 1610    Vanetta Mulders, MD 05/29/17 [phone]

## 2017-05-24 ENCOUNTER — Other Ambulatory Visit: Payer: Self-pay | Admitting: Urology

## 2017-06-10 ENCOUNTER — Ambulatory Visit (HOSPITAL_COMMUNITY): Admission: RE | Admit: 2017-06-10 | Payer: 59 | Source: Ambulatory Visit | Admitting: Urology

## 2017-06-10 ENCOUNTER — Encounter (HOSPITAL_COMMUNITY): Admission: RE | Payer: Self-pay | Source: Ambulatory Visit

## 2017-06-10 SURGERY — CYSTOSCOPY/URETEROSCOPY/HOLMIUM LASER/STENT PLACEMENT
Anesthesia: General | Laterality: Right

## 2018-01-19 DIAGNOSIS — N200 Calculus of kidney: Secondary | ICD-10-CM | POA: Diagnosis not present

## 2018-04-13 IMAGING — CT CT RENAL STONE PROTOCOL
2 of 4 series · 16 of 46 positions shown, 18 images · non-contrast
Comparison: CT abdomen and pelvis April 27, 2011

CLINICAL DATA: RIGHT flank pain and nausea today. History of kidney
stones, diverticulosis.

EXAM:
CT ABDOMEN AND PELVIS WITHOUT CONTRAST
TECHNIQUE: Multidetector CT imaging of the abdomen and pelvis was performed
following the standard protocol without IV contrast.

[Series 2: axial st · axial · 0.79mm/px · z∈[+476,+901]mm · 13 of 93 slices shown, 15 images]
[im 4/93  soft-tissue]
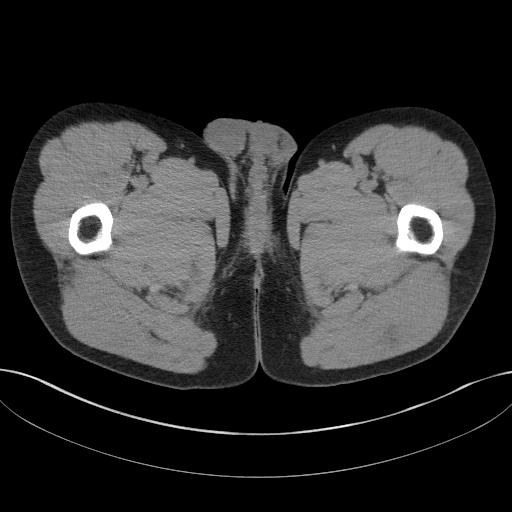
[im 4/93  bone]
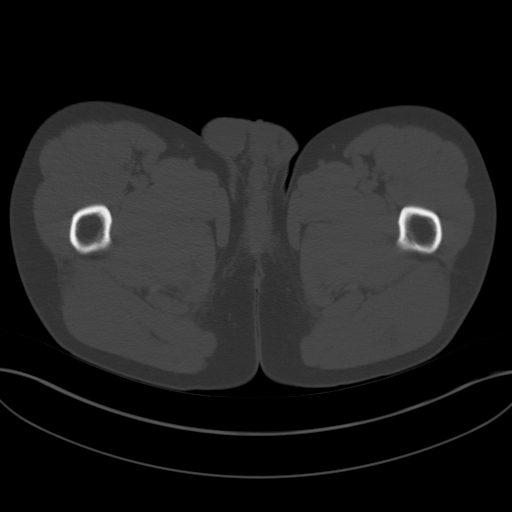
[im 12/93  soft-tissue]
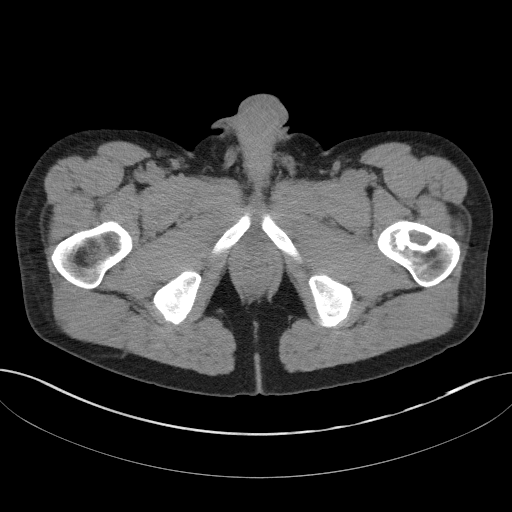
[im 20/93  soft-tissue]
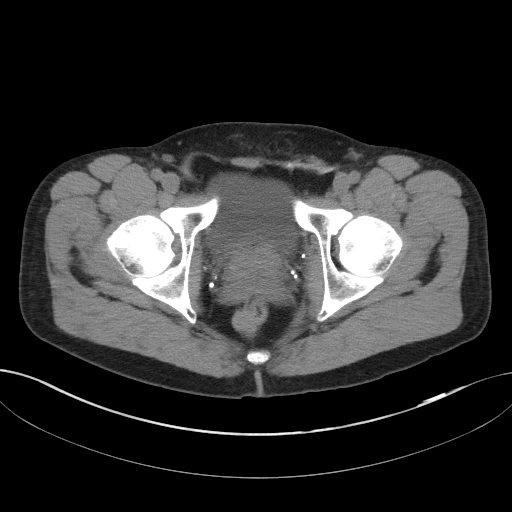
[im 27/93  soft-tissue]
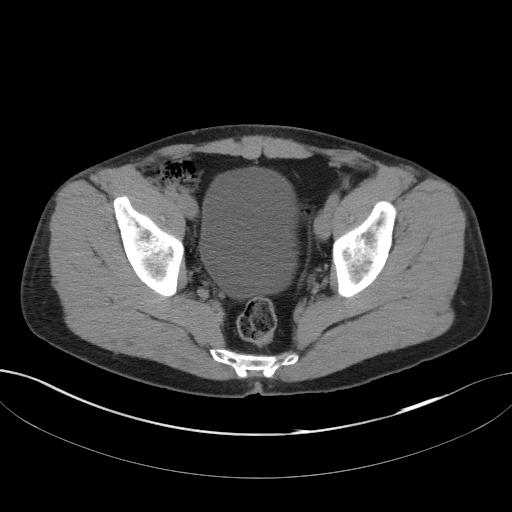
[im 31/93  soft-tissue]
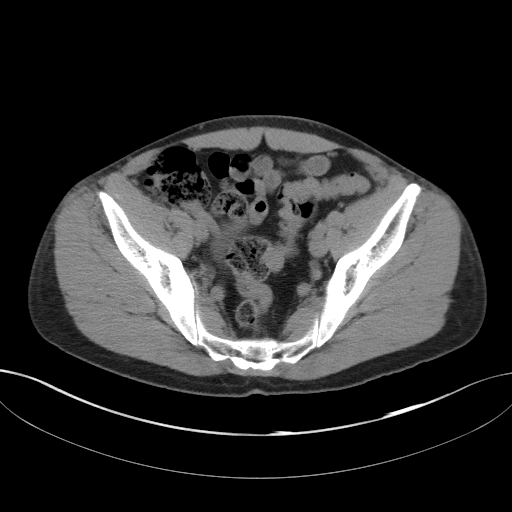
[im 39/93  soft-tissue]
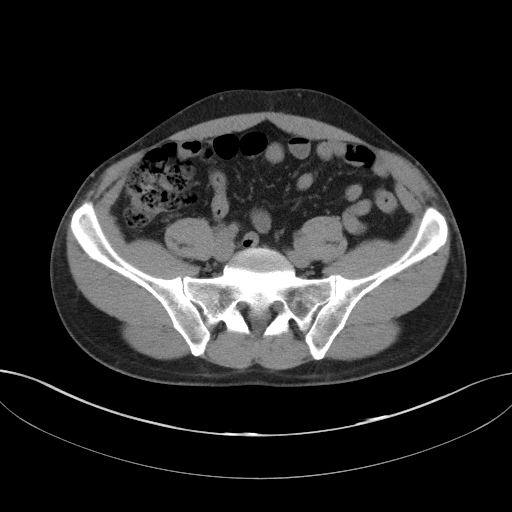
[im 47/93  soft-tissue]
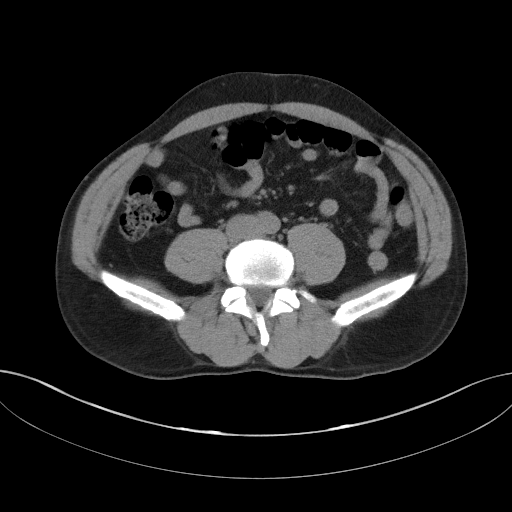
[im 54/93  soft-tissue]
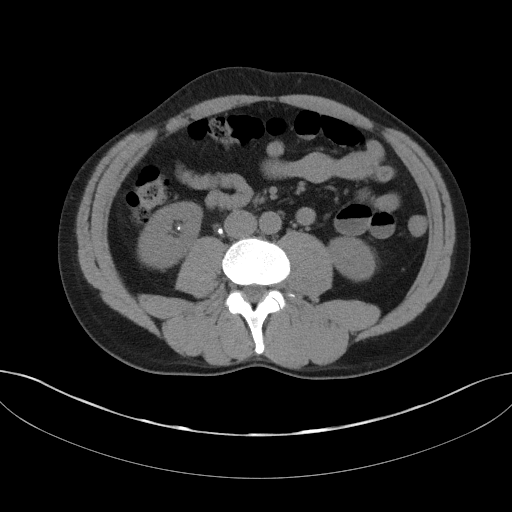
[im 62/93  soft-tissue]
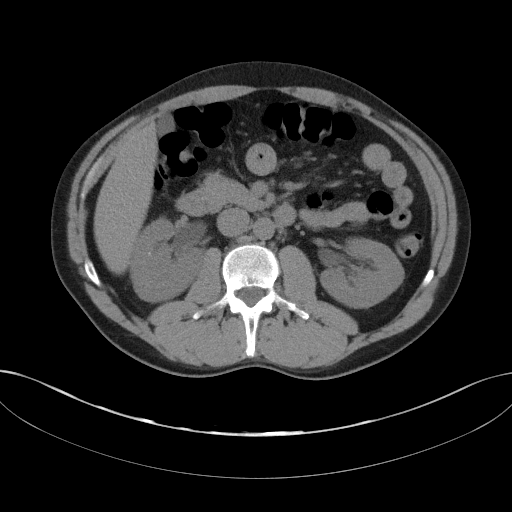
[im 62/93  bone]
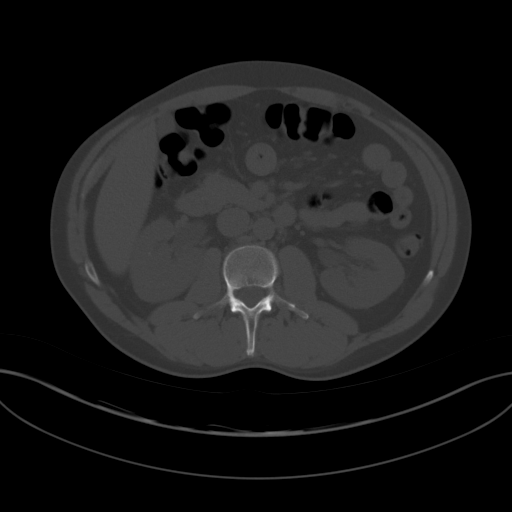
[im 66/93  soft-tissue]
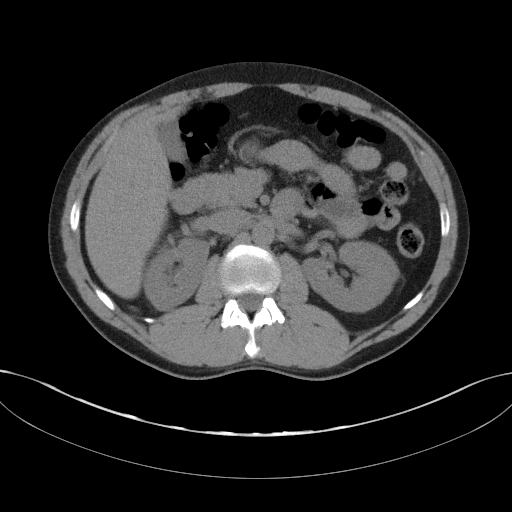
[im 73/93  soft-tissue]
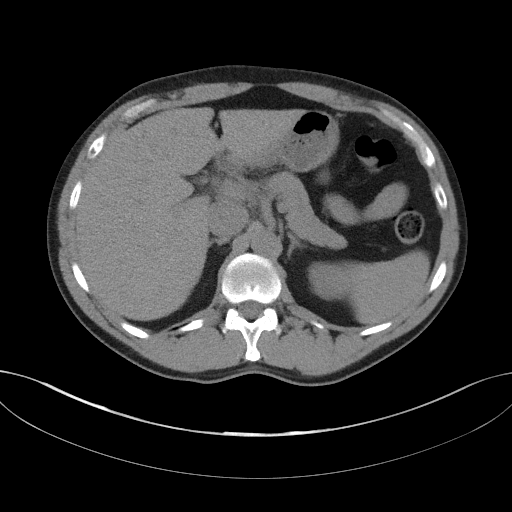
[im 81/93  soft-tissue]
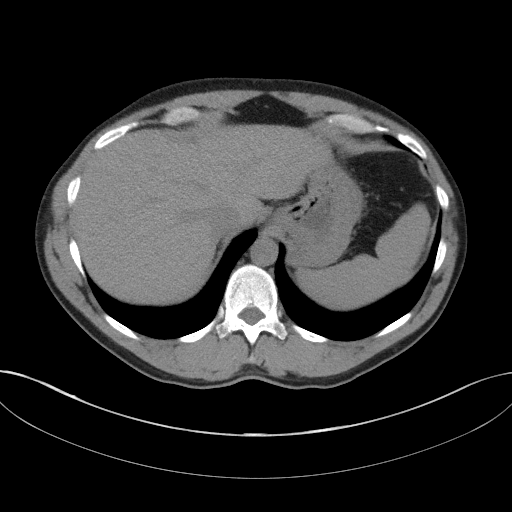
[im 89/93  soft-tissue]
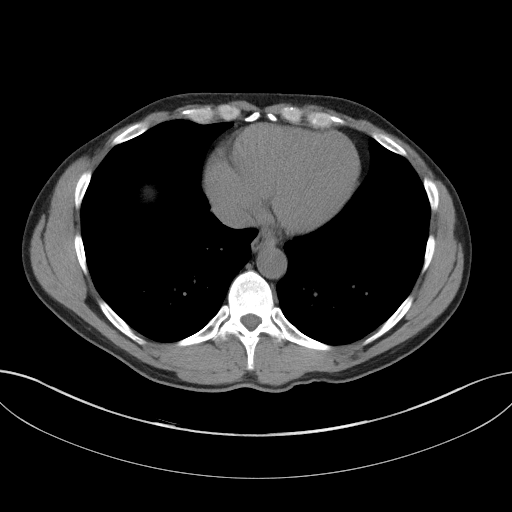

[Series 5: coronal st · coronal · 0.72mm/px · 3 of 83 slices shown]
[im 28/83  soft-tissue]
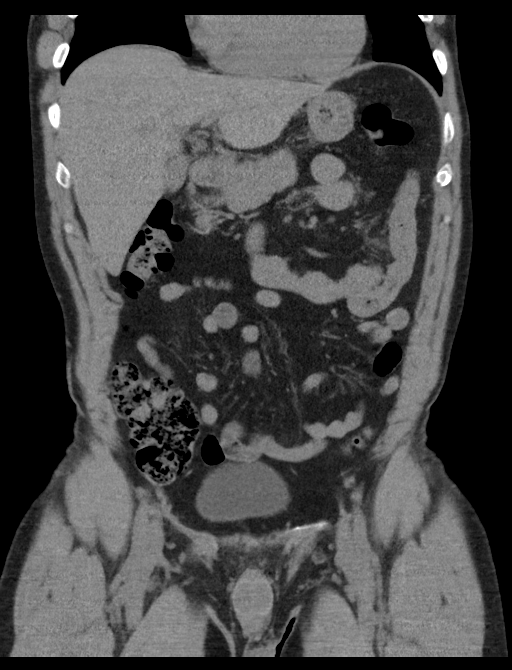
[im 37/83  soft-tissue]
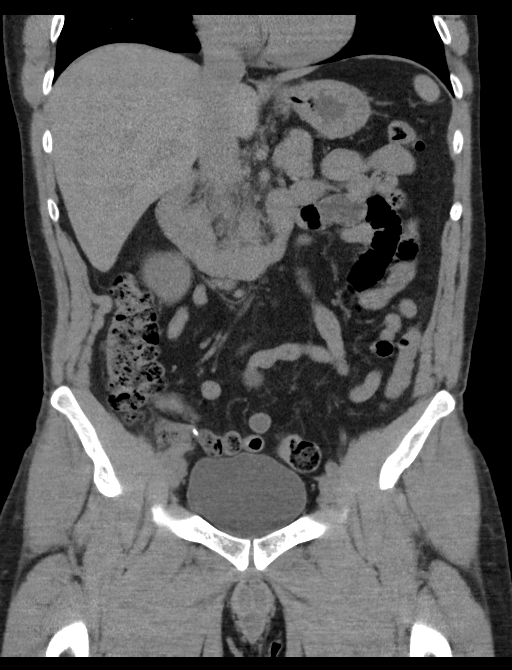
[im 46/83  soft-tissue]
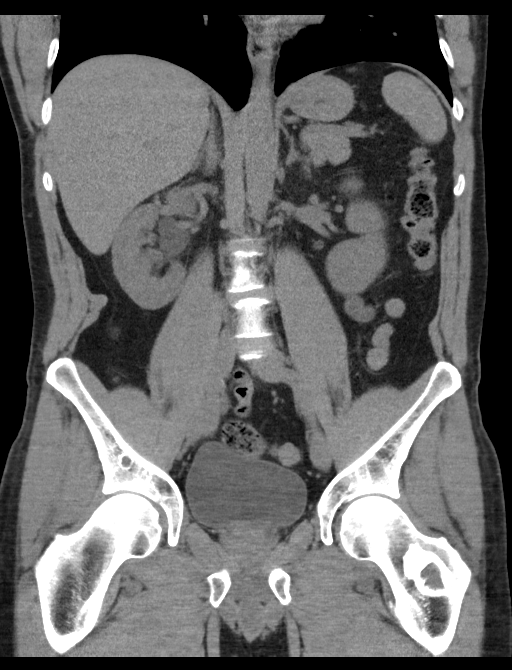

[16 of 46 positions shown; findings below may reference images not displayed]

FINDINGS: LOWER CHEST: Lung bases are clear. The visualized heart size is
normal. No pericardial effusion.

HEPATOBILIARY: Normal.

PANCREAS: Normal.

SPLEEN: Normal.

ADRENALS/URINARY TRACT: Kidneys are orthotopic, demonstrating normal
size and morphology. Mild RIGHT hydro nephrosis to level proximal
ureter where a 4 mm calculus is present. Bilateral nephrolithiasis
measuring to 2 mm. No hydronephrosis. Assessment for renal masses on
this nonenhanced examination. The unopacified ureters are normal in
course and caliber. Urinary bladder is well distended and
unremarkable. Normal adrenal glands.

STOMACH/BOWEL: Tiny hiatal hernia. The stomach, small and large
bowel are normal in course and caliber without inflammatory changes,
sensitivity decreased by lack of enteric contrast. Mild sigmoid
colonic diverticulosis. Status post appendectomy.

VASCULAR/LYMPHATIC: Aortoiliac vessels are normal in course and
caliber. No lymphadenopathy by CT size criteria. Phleboliths in the
pelvis.

REPRODUCTIVE: Normal.

OTHER: No intraperitoneal free fluid or free air.

MUSCULOSKELETAL: Non-acute.
IMPRESSION: 1. 4 mm proximal RIGHT ureteral calculus resulting in mild
hydronephrosis.
2. Bilateral nephrolithiasis measuring to 2 mm.

## 2018-11-15 ENCOUNTER — Telehealth: Payer: Self-pay

## 2018-11-15 NOTE — Progress Notes (Deleted)
Subjective:   Christian Rivas, male    DOB: Jan 19, 1966, 53 y.o.   MRN: [id number]  Bernerd Limbo, MD:  No chief complaint on file.   HPI: Christian Rivas  is a 53 y.o. male  with paroxysmal atrial fibrillation noted on 10 day event monitor in June 2018 and hypertension.   In 2018, he underwent nuclear stress testing and echocardiogram that were normal. He was started on Metoprolol and since being on the medication has not had any further episodes. He also has a prescription of flecainide in case he has an episode. He has symptomatic A fib with heart racing.   Patient is here on 6 month follow up. He is tolerating medication well, no new complaints today.  He does exercise daily with running and denies any difficulty with this.   Past Medical History:  Diagnosis Date  . Allergy   . Kidney stones     Past Surgical History:  Procedure Laterality Date  . APPENDECTOMY    . HERNIA REPAIR      No family history on file.  Social History   Socioeconomic History  . Marital status: Married    Spouse name: Not on file  . Number of children: Not on file  . Years of education: Not on file  . Highest education level: Not on file  Occupational History  . Not on file  Social Needs  . Financial resource strain: Not on file  . Food insecurity:    Worry: Not on file    Inability: Not on file  . Transportation needs:    Medical: Not on file    Non-medical: Not on file  Tobacco Use  . Smoking status: Never Smoker  . Smokeless tobacco: Never Used  Substance and Sexual Activity  . Alcohol use: Yes  . Drug use: No  . Sexual activity: Not on file  Lifestyle  . Physical activity:    Days per week: Not on file    Minutes per session: Not on file  . Stress: Not on file  Relationships  . Social connections:    Talks on phone: Not on file    Gets together: Not on file    Attends religious service: Not on file    Active member of club or organization: Not on file   Attends meetings of clubs or organizations: Not on file    Relationship status: Not on file  . Intimate partner violence:    Fear of current or ex partner: Not on file    Emotionally abused: Not on file    Physically abused: Not on file    Forced sexual activity: Not on file  Other Topics Concern  . Not on file  Social History Narrative  . Not on file    No outpatient medications have been marked as taking for the 11/16/18 encounter (Appointment) with Miquel Dunn, NP.     Review of Systems  Constitution: Negative for decreased appetite, malaise/fatigue, weight gain and weight loss.  Eyes: Negative for visual disturbance.  Cardiovascular: Negative for chest pain, claudication, dyspnea on exertion, leg swelling, orthopnea, palpitations and syncope.  Respiratory: Negative for hemoptysis and wheezing.   Endocrine: Negative for cold intolerance and heat intolerance.  Hematologic/Lymphatic: Does not bruise/bleed easily.  Skin: Negative for nail changes.  Musculoskeletal: Negative for muscle weakness and myalgias.  Gastrointestinal: Negative for abdominal pain, change in bowel habit, nausea and vomiting.  Neurological: Negative for difficulty with concentration, dizziness, focal  weakness and headaches.  Psychiatric/Behavioral: Negative for altered mental status and suicidal ideas.  All other systems reviewed and are negative.      Objective:     There were no vitals taken for this visit.  Cardiac studies:  Exercise sestamibi stress test 02/05/2017: 1. The resting electrocardiogram demonstrated normal sinus rhythm, normal resting conduction, no resting arrhythmias and normal rest repolarization.  The stress electrocardiogram was normal.  Patient exercised on Bruce protocol for 12:30 minutes and achieved 13.8 METS. Stress test terminated due to 102% MPHR achieved (Target HR >85%). Normal BP response. No A. Fibrillation.  2. Stress and rest SPECT images demonstrate  homogeneous tracer distribution throughout the myocardium. Gated SPECT imaging reveals normal myocardial thickening and wall motion. The left ventricular ejection fraction was normal  visually but calculated at (47%).  Low risk stress test with excellent effort.  Echocardiogram 02/11/2017: Left ventricle cavity is normal in size. Mild concentric hypertrophy of the left ventricle. Normal global wall motion. Visual EF is 50-55%. Normal diastolic filling pattern, normal LAP. Trace mitral regurgitation. Trace to mild tricuspid regurgitation. No evidence of pulmonary hypertension. Trace pulmonic regurgitation. Essentially normal echocardiogram.  10 day event monitoring 01/27/2017-02/10/2017: Paroxysmal A. fib with RVR. Wide-complex tachycardia.  CHA2DS2-VASc Score is 1 with yearly risk of stroke of 1.3 %. HAS-Bled score is 0 and estimated major bleeding in one year is 0.6-1.13 %  Recent Labs: 09/21/2017: Glucose 100, creatinine 0.9, EGFR 88/102, potassium 4.4, BMP normal.  Physical Exam  Constitutional: He is oriented to person, place, and time. Vital signs are normal. He appears well-developed and well-nourished.  HENT:  Head: Normocephalic and atraumatic.  Neck: Normal range of motion.  Cardiovascular: Normal rate, regular rhythm, normal heart sounds and intact distal pulses.  Pulmonary/Chest: Effort normal and breath sounds normal. No accessory muscle usage. No respiratory distress.  Abdominal: Soft. Bowel sounds are normal.  Musculoskeletal: Normal range of motion.  Neurological: He is alert and oriented to person, place, and time.  Skin: Skin is warm and dry.  Vitals reviewed.          Assessment & Recommendations:  There are no diagnoses linked to this encounter.   Plan: Patient presents for 4 week follow-up visit for hypertension. Since starting lisinopril, blood pressure has been much better controlled. He is tolerating medication well. No new complaints today. He has not  had any recurrent episodes of atrial fibrillation since starting daily metoprolol. Continues to exercise regularly without exertional difficulty. I discussed recently obtained labs, kidney function has remained stable. As patient is stable from a cardiac standpoint, we'll see him back on an annual basis for follow-up of his paroxysmal atrial fibrillation. Advised him to contact us should he began having frequent episodes of A. fib.   Jeri Lager, MSN, APRN, FNP-C [facility], [place] Office: [phone] Fax: [phone]

## 2018-11-16 ENCOUNTER — Ambulatory Visit: Payer: 59 | Admitting: Cardiology

## 2018-11-28 ENCOUNTER — Other Ambulatory Visit: Payer: Self-pay

## 2018-11-28 MED ORDER — METOPROLOL TARTRATE 50 MG PO TABS: 50.0000 mg | ORAL_TABLET | Freq: Two times a day (BID) | ORAL | 1 refills | Status: DC

## 2018-12-19 ENCOUNTER — Other Ambulatory Visit: Payer: Self-pay | Admitting: Cardiology

## 2019-01-09 ENCOUNTER — Encounter: Payer: Self-pay | Admitting: Cardiology

## 2019-01-10 ENCOUNTER — Other Ambulatory Visit: Payer: Self-pay

## 2019-01-10 ENCOUNTER — Ambulatory Visit: Payer: BLUE CROSS/BLUE SHIELD | Admitting: Cardiology

## 2019-01-10 ENCOUNTER — Encounter: Payer: Self-pay | Admitting: Cardiology

## 2019-01-10 VITALS — BP 144/84 | HR 74 | Ht 68.0 in | Wt 160.0 lb

## 2019-01-10 DIAGNOSIS — I480 Paroxysmal atrial fibrillation: Secondary | ICD-10-CM

## 2019-01-10 DIAGNOSIS — I10 Essential (primary) hypertension: Secondary | ICD-10-CM

## 2019-01-10 DIAGNOSIS — R05 Cough: Secondary | ICD-10-CM | POA: Diagnosis not present

## 2019-01-10 DIAGNOSIS — T464X5A Adverse effect of angiotensin-converting-enzyme inhibitors, initial encounter: Secondary | ICD-10-CM

## 2019-01-10 MED ORDER — LOSARTAN POTASSIUM 25 MG PO TABS: 25.0000 mg | ORAL_TABLET | Freq: Every day | ORAL | 3 refills | Status: DC

## 2019-01-10 NOTE — Progress Notes (Signed)
Primary Physician:  Bernerd Limbo, MD   Patient ID: Christian Rivas, male    DOB: [birth year], 53 y.o.   MRN: [id number]  Subjective:    Chief Complaint  Patient presents with   Atrial Fibrillation   Hypertension   This visit type was conducted due to national recommendations for restrictions regarding the COVID-19 Pandemic (e.g. social distancing).  This format is felt to be most appropriate for this patient at this time.  All issues noted in this document were discussed and addressed.  No physical exam was performed (except for noted visual exam findings with Telehealth visits).  The patient has consented to conduct a Telehealth visit and understands insurance will be billed.   I discussed the limitations of evaluation and management by telemedicine and the availability of in person appointments. The patient expressed understanding and agreed to proceed.  Virtual Visit via Video Note is as below  I connected with Mr. Seidman, on 01/10/19 at [place] by a video enabled telemedicine application and verified that I am speaking with the correct person using two identifiers.     I have discussed with her regarding the safety during COVID Pandemic and steps and precautions including social distancing with the patient.    HPI: Christian Rivas  is a 53 y.o. male  with history of paroxysmal atrial fibrillation. He began having episodes of heart racing and 10 day event monitoring in June 2018 showed episodes of A fib. Nuclear stress testing and echocardiogram at that time were normal. He was started on Metoprolol and since being on the medication has not had any further episodes. He also has a prescription of flecainide in case he has an episode. Denies any palpitations or chest pain. No dyspnea.  This is a 1 year follow up for paroxysmal atrial fibrillation and hypertension. He is tolerating medication well, but does tate that he has a annoying dry cough mostly at night. He has not had any  palpitations. States that he feels well.   He does exercise daily with riding stationary bicycle and lifting weights. 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   Past Medical History:  Diagnosis Date   Allergy    Kidney stones     Past Surgical History:  Procedure Laterality Date   APPENDECTOMY     HERNIA REPAIR      Social History   Socioeconomic History   Marital status: Married    Spouse name: Not on file   Number of children: 3   Years of education: Not on file   Highest education level: Not on file  Occupational History   Not on file  Social Needs   Financial resource strain: Not on file   Food insecurity:    Worry: Not on file    Inability: Not on file   Transportation needs:    Medical: Not on file    Non-medical: Not on file  Tobacco Use   Smoking status: Never Smoker   Smokeless tobacco: Never Used  Substance and Sexual Activity   Alcohol use: Yes    Comment: occ   Drug use: No   Sexual activity: Not on file  Lifestyle   Physical activity:    Days per week: Not on file    Minutes per session: Not on file   Stress: Not on file  Relationships   Social connections:    Talks on phone: Not on file    Gets together: Not on file  Attends religious service: Not on file    Active member of club or organization: Not on file    Attends meetings of clubs or organizations: Not on file    Relationship status: Not on file   Intimate partner violence:    Fear of current or ex partner: Not on file    Emotionally abused: Not on file    Physically abused: Not on file    Forced sexual activity: Not on file  Other Topics Concern   Not on file  Social History Narrative   Not on file    Review of Systems  Constitution: Negative for decreased appetite, malaise/fatigue, weight gain and weight loss.  Eyes: Negative for visual disturbance.  Cardiovascular: Negative for chest pain, claudication, dyspnea on exertion, leg swelling,  orthopnea, palpitations and syncope.  Respiratory: Positive for cough (dry cough). Negative for hemoptysis and wheezing.   Endocrine: Negative for cold intolerance and heat intolerance.  Hematologic/Lymphatic: Does not bruise/bleed easily.  Skin: Negative for nail changes.  Musculoskeletal: Negative for muscle weakness and myalgias.  Gastrointestinal: Negative for abdominal pain, change in bowel habit, nausea and vomiting.  Neurological: Negative for difficulty with concentration, dizziness, focal weakness and headaches.  Psychiatric/Behavioral: Negative for altered mental status and suicidal ideas.  All other systems reviewed and are negative.     Objective:  Blood pressure (!) 144/84, pulse 74, height _0  (1.727 m), weight 160 lb (72.6 kg). Body mass index is 24.33 kg/m.  Physical exam not performed or limited due to virtual visit.  Patient appeared to be in no distress, Neck was supple, respiration was not labored.  Please see exam details from prior visit is as below.     Physical Exam  Constitutional: He is oriented to person, place, and time. Vital signs are normal. He appears well-developed and well-nourished.  HENT:  Head: Normocephalic and atraumatic.  Neck: Normal range of motion.  Cardiovascular: Normal rate, regular rhythm, normal heart sounds and intact distal pulses.  Pulmonary/Chest: Effort normal and breath sounds normal. No accessory muscle usage. No respiratory distress.  Abdominal: Soft. Bowel sounds are normal.  Musculoskeletal: Normal range of motion.  Neurological: He is alert and oriented to person, place, and time.  Skin: Skin is warm and dry.  Vitals reviewed.  Radiology: No results found.  Laboratory examination:   09/21/2017: Glucose 100, creatinine 0.9, EGFR 88/102, potassium 4.4, BMP normal.  CMP Latest Ref Rng & Units 04/27/2011  Glucose 70 - 99 mg/dL 122(H)  BUN 6 - 23 mg/dL 17  Creatinine 0.50 - 1.35 mg/dL 0.95  Sodium 135 - 145 mEq/L 138    Potassium 3.5 - 5.1 mEq/L 4.0  Chloride 96 - 112 mEq/L 100  CO2 19 - 32 mEq/L 30  Calcium 8.4 - 10.5 mg/dL 9.7   CBC Latest Ref Rng & Units 04/27/2011  WBC 4.0 - 10.5 K/uL 11.7(H)  Hemoglobin 13.0 - 17.0 g/dL 14.9  Hematocrit 39.0 - 52.0 % 43.1  Platelets 150 - 400 K/uL 255   Lipid Panel  No results found for: CHOL, TRIG, HDL, CHOLHDL, VLDL, LDLCALC, LDLDIRECT HEMOGLOBIN A1C No results found for: HGBA1C, MPG TSH No results for input(s): TSH in the last 8760 hours.  PRN Meds:. Medications Discontinued During This Encounter  Medication Reason   oxyCODONE-acetaminophen (PERCOCET/ROXICET) 5-325 MG tablet Completed Course   promethazine (PHENERGAN) 25 MG tablet Completed Course   lisinopril (ZESTRIL) 5 MG tablet Discontinued by provider   Current Meds  Medication Sig   metoprolol tartrate (LOPRESSOR)  50 MG tablet Take 1 tablet (50 mg total) by mouth 2 (two) times daily.   [DISCONTINUED] lisinopril (ZESTRIL) 5 MG tablet TAKE 1 TABLET BY MOUTH EVERY DAY    Cardiac Studies:   Echocardiogram 02/11/2017: Left ventricle cavity is normal in size. Mild concentric hypertrophy of the left ventricle. Normal global wall motion. Visual EF is 50-55%. Normal diastolic filling pattern, normal LAP. Trace mitral regurgitation. Trace to mild tricuspid regurgitation. No evidence of pulmonary hypertension. Trace pulmonic regurgitation. Essentially normal echocardiogram.  Exercise sestamibi stress test 02/05/2017: 1. The resting electrocardiogram demonstrated normal sinus rhythm, normal resting conduction, no resting arrhythmias and normal rest repolarization. The stress electrocardiogram was normal. Patient exercised on Bruce protocol for 12:30 minutes and achieved 13.8 METS. Stress test terminated due to 102% MPHR achieved (Target HR >85%). Normal BP response. No A. Fibrillation. 2. Stress and rest SPECT images demonstrate homogeneous tracer distribution throughout the myocardium. Gated SPECT  imaging reveals normal myocardial thickening and wall motion. The left ventricular ejection fraction was normal visually but calculated at (47%). Low risk stress test with excellent effort.  10 day event monitoring 01/27/2017-02/10/2017: Paroxysmal A. fib with RVR.  Wide-complex tachycardia.  Assessment:   Paroxysmal atrial fibrillation (HCC) - CHA2DS2-VASc Score is 1 with yearly risk of stroke of  1.3 %.  HAS-Bled score is 0 and estimated major bleeding in one year is 0.6-1.13 % - Plan: Basic Metabolic Panel (BMET), CBC, Lipid Profile  Essential hypertension - Plan: Basic Metabolic Panel (BMET), CBC, Lipid Profile  ACE-inhibitor cough  EKG 09/08/2017: Normal sinus rhythm at 55 bpm, normal axis, no evidence of ischemia. Normal EKG. No change from 01/27/2017  Recommendations:   This is a 1 year virtual visit for follow-up on paroxysmal atrial fibrillation and hypertension.  He has not had any palpitations since last seen by me 1 year ago.  He has not used flecainide.  He does keep on hand in case he has an episode.  Continue with metoprolol tartrate twice a day.  He does monitor his blood pressure regularly and generally in the 130s over 80s, slightly elevated this morning.  Due to cough likely related to lisinopril, I will change him to losartan 25 mg daily.  He does not have upcoming appointment with Dr. Coletta Memos as he states he is doing well.  I will obtain CBC, BMP, and lipid panel in 4 weeks for surveillance and notify him of results.  Urged him to continue with regular exercise.  He will notify me of any problems with changing from lisinopril to losartan.  As he is doing well, we will see him back in 1 year or sooner if problems.  Miquel Dunn, MSN, APRN, FNP-C [facility]. [place] Office: [phone] Fax: [phone]

## 2019-03-02 DIAGNOSIS — Z20828 Contact with and (suspected) exposure to other viral communicable diseases: Secondary | ICD-10-CM | POA: Diagnosis not present

## 2019-03-02 DIAGNOSIS — I10 Essential (primary) hypertension: Secondary | ICD-10-CM | POA: Diagnosis not present

## 2019-04-28 ENCOUNTER — Other Ambulatory Visit: Payer: Self-pay

## 2019-04-28 DIAGNOSIS — Z20822 Contact with and (suspected) exposure to covid-19: Secondary | ICD-10-CM

## 2019-04-28 DIAGNOSIS — R6889 Other general symptoms and signs: Secondary | ICD-10-CM | POA: Diagnosis not present

## 2019-04-29 LAB — NOVEL CORONAVIRUS, NAA: SARS-CoV-2, NAA: NOT DETECTED

## 2019-05-09 DIAGNOSIS — Z23 Encounter for immunization: Secondary | ICD-10-CM | POA: Diagnosis not present

## 2019-06-18 ENCOUNTER — Other Ambulatory Visit: Payer: Self-pay | Admitting: Cardiology

## 2019-07-06 DIAGNOSIS — Z20828 Contact with and (suspected) exposure to other viral communicable diseases: Secondary | ICD-10-CM | POA: Diagnosis not present

## 2020-01-09 ENCOUNTER — Ambulatory Visit: Payer: BLUE CROSS/BLUE SHIELD | Admitting: Cardiology

## 2020-01-15 ENCOUNTER — Encounter: Payer: Self-pay | Admitting: Cardiology

## 2020-01-17 ENCOUNTER — Ambulatory Visit: Payer: 59 | Admitting: Cardiology

## 2020-02-08 ENCOUNTER — Encounter: Payer: Self-pay | Admitting: Cardiology

## 2020-02-08 ENCOUNTER — Ambulatory Visit: Payer: 59 | Admitting: Cardiology

## 2020-02-08 ENCOUNTER — Other Ambulatory Visit: Payer: Self-pay

## 2020-02-08 VITALS — BP 135/92 | HR 69 | Resp 16 | Ht 68.0 in | Wt 165.0 lb

## 2020-02-08 DIAGNOSIS — I480 Paroxysmal atrial fibrillation: Secondary | ICD-10-CM

## 2020-02-08 DIAGNOSIS — I10 Essential (primary) hypertension: Secondary | ICD-10-CM | POA: Insufficient documentation

## 2020-02-08 MED ORDER — LOSARTAN POTASSIUM 50 MG PO TABS: 50.0000 mg | ORAL_TABLET | Freq: Every day | ORAL | 3 refills | Status: DC

## 2020-02-08 NOTE — Progress Notes (Signed)
Follow up visit  Subjective:   Christian Rivas, male    DOB: Nov 29, 1965, 54 y.o.   MRN: [id number]   HPI  Chief Complaint  Patient presents with  . Atrial Fibrillation  . Hypertension  . Follow-up    1 year    54 y/o Caucasian male with hypertension, paroxysmal Afib  Patient owns a [employer].  He runs 2-3 miles every other day, and also lifts weights.  He denies chest pain, shortness of breath, palpitations, leg edema, orthopnea, PND, TIA/syncope. Blood pressure is elevated.  His Afib was in 2018, and has not had any recurrence since then.    Current Outpatient Medications on File Prior to Visit  Medication Sig Dispense Refill  . Ascorbic Acid (VITAMIN C) 1000 MG tablet Take 1,000 mg by mouth daily.    Marland Kitchen aspirin EC 81 MG tablet Take 81 mg by mouth daily. Swallow whole.    Marland Kitchen FOLIC ACID PO Take 1 tablet by mouth daily.    . metoprolol tartrate (LOPRESSOR) 50 MG tablet TAKE 1 TABLET BY MOUTH TWICE A DAY 180 tablet 1  . Multiple Vitamin (MULTIVITAMIN) tablet Take 1 tablet by mouth daily.    Marland Kitchen VITAMIN D PO Take 1 tablet by mouth daily.    Marland Kitchen VITAMIN E COMPLEX PO Take 1 tablet by mouth daily.    Marland Kitchen losartan (COZAAR) 25 MG tablet Take 1 tablet (25 mg total) by mouth daily. 90 tablet 3   No current facility-administered medications on file prior to visit.    Cardiovascular & other pertient studies:  EKG 02/08/2020:  Sinus rhythm 66 bpm Normal EKG  Echocardiogram 02/11/2017: Left ventricle cavity is normal in size. Mild concentric hypertrophy of the left ventricle. Normal global wall motion. Visual EF is 50-55%. Normal diastolic filling pattern, normal LAP. Trace mitral regurgitation. Trace to mild tricuspid regurgitation. No evidence of pulmonary hypertension. Trace pulmonic regurgitation. Essentially normal echocardiogram.  Exercise sestamibi stress test 02/05/2017: 1. The resting electrocardiogram demonstrated normal sinus rhythm, normal resting conduction,  no resting arrhythmias and normal rest repolarization. The stress electrocardiogram was normal. Patient exercised on Bruce protocol for 12:30 minutes and achieved 13.8 METS. Stress test terminated due to 102% MPHR achieved (Target HR >85%). Normal BP response. No A. Fibrillation. 2. Stress and rest SPECT images demonstrate homogeneous tracer distribution throughout the myocardium. Gated SPECT imaging reveals normal myocardial thickening and wall motion. The left ventricular ejection fraction was normal visually but calculated at (47%). Low risk stress test with excellent effort.  10 day event monitoring 01/27/2017-02/10/2017: Paroxysmal A. fib with RVR.  Wide-complex tachycardia.  Recent labs: None available    Review of Systems  Cardiovascular: Negative for chest pain, dyspnea on exertion, leg swelling, palpitations and syncope.         Vitals:   02/08/20 0915 02/08/20 0919  BP: (!) 148/100 (!) 135/92  Pulse: 77 69  Resp: 16   SpO2: 97% 98%     Body mass index is 25.09 kg/m. Filed Weights   02/08/20 0915  Weight: 165 lb (74.8 kg)     Objective:   Physical Exam Vitals and nursing note reviewed.  Constitutional:      General: He is not in acute distress. Neck:     Vascular: No JVD.  Cardiovascular:     Rate and Rhythm: Normal rate and regular rhythm.     Pulses: Intact distal pulses.     Heart sounds: Normal heart sounds. No murmur heard.   Pulmonary:  Effort: Pulmonary effort is normal.     Breath sounds: Normal breath sounds. No wheezing or rales.           Assessment & Recommendations:   54 y/o Caucasian male with hypertension, paroxysmal Afib  Paroxysmal Afib: No recurrence. CHA2DS2VASc score 1, annual stroke risk 0.6%. No indication for Aspirin  Hypertension: Uncontrolled. Increase losartan to 50 mg daily. Continue metoprolol tartarate 50 mg bid  BMP, lipid panel in 1 week  F/u in 1 year   Nigel Mormon, MD [facility]  Cardiovascular. [place] Pager: [phone] Office: [phone]

## 2020-02-12 ENCOUNTER — Other Ambulatory Visit: Payer: Self-pay | Admitting: Cardiology

## 2020-07-21 ENCOUNTER — Other Ambulatory Visit: Payer: Self-pay | Admitting: Cardiology

## 2021-02-07 ENCOUNTER — Ambulatory Visit: Payer: 59 | Admitting: Cardiology

## 2021-02-19 ENCOUNTER — Other Ambulatory Visit: Payer: Self-pay

## 2021-02-19 ENCOUNTER — Ambulatory Visit: Payer: 59 | Admitting: Cardiology

## 2021-02-19 ENCOUNTER — Encounter: Payer: Self-pay | Admitting: Cardiology

## 2021-02-19 VITALS — BP 151/88 | HR 70 | Temp 98.7°F | Resp 16 | Ht 69.0 in | Wt 170.0 lb

## 2021-02-19 DIAGNOSIS — I10 Essential (primary) hypertension: Secondary | ICD-10-CM

## 2021-02-19 DIAGNOSIS — I480 Paroxysmal atrial fibrillation: Secondary | ICD-10-CM

## 2021-02-19 MED ORDER — LOSARTAN POTASSIUM 50 MG PO TABS: 50.0000 mg | ORAL_TABLET | Freq: Every day | ORAL | 3 refills | Status: DC

## 2021-02-19 MED ORDER — METOPROLOL TARTRATE 50 MG PO TABS: 50.0000 mg | ORAL_TABLET | Freq: Two times a day (BID) | ORAL | 1 refills | Status: DC

## 2021-02-19 NOTE — Progress Notes (Signed)
Follow up visit  Subjective:   Christian Rivas, male    DOB: [birth year], 55 y.o.   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  He denies chest pain, shortness of breath, palpitations, leg edema, orthopnea, PND, TIA/syncope. Blood pressure is elevated.  His Afib was in 2018, and has not had any recurrence since then. BP is lower at home, always elevated at doctor's office.   Current Outpatient Medications on File Prior to Visit  Medication Sig Dispense Refill   Ascorbic Acid (VITAMIN C) 1000 MG tablet Take 1,000 mg by mouth daily.     FOLIC ACID PO Take 1 tablet by mouth daily.     losartan (COZAAR) 50 MG tablet Take 1 tablet (50 mg total) by mouth daily. 90 tablet 3   metoprolol tartrate (LOPRESSOR) 50 MG tablet TAKE 1 TABLET BY MOUTH TWICE A DAY 180 tablet 1   Multiple Vitamin (MULTIVITAMIN) tablet Take 1 tablet by mouth daily.     VITAMIN D PO Take 1 tablet by mouth daily.     VITAMIN E COMPLEX PO Take 1 tablet by mouth daily.     No current facility-administered medications on file prior to visit.    Cardiovascular & other pertient studies:  EKG 02/19/2021: Sinus rhythm 70 bpm  Normal EKG  Echocardiogram 02/11/2017: Left ventricle cavity is normal in size. Mild concentric hypertrophy of the left ventricle. Normal global wall motion. Visual EF is 50-55%. Normal diastolic filling pattern, normal LAP. Trace mitral regurgitation. Trace to mild tricuspid regurgitation. No evidence of pulmonary hypertension. Trace pulmonic regurgitation. Essentially normal echocardiogram.   Exercise sestamibi stress test 02/05/2017: 1. The resting electrocardiogram demonstrated normal sinus rhythm, normal resting conduction, no resting arrhythmias and normal rest  repolarization.  The stress electrocardiogram was normal.  Patient exercised on Bruce protocol for 12:30 minutes and achieved 13.8 METS. Stress test terminated due to 102% MPHR achieved (Target HR >85%). Normal BP response. No A. Fibrillation. 2. Stress and rest SPECT images demonstrate homogeneous tracer distribution throughout the myocardium. Gated SPECT imaging reveals normal myocardial thickening and wall motion. The left ventricular ejection fraction was normal  visually but calculated at (47%).  Low risk stress test with excellent effort.   10 day event monitoring 01/27/2017-02/10/2017: Paroxysmal A. fib with RVR.  Wide-complex tachycardia.  Recent labs: None available    Review of Systems  Cardiovascular:  Negative for chest pain, dyspnea on exertion, leg swelling, palpitations and syncope.        Vitals:   02/19/21 0847 02/19/21 0849  BP: (!) 154/92 (!) 151/88  Pulse: 76 70  Resp: 16   Temp: 98.7 F (37.1 C)   SpO2: 97% 97%    Body mass index is 25.1 kg/m. Filed Weights   02/19/21 0847  Weight: 170 lb (77.1 kg)     Objective:   Physical Exam Vitals and nursing note reviewed.  Constitutional:      General: He is not in acute distress. Neck:     Vascular: No JVD.  Cardiovascular:     Rate and Rhythm: Normal rate and regular rhythm.     Pulses: Intact distal pulses.     Heart sounds: Normal heart sounds. No murmur heard. Pulmonary:     Effort:  Pulmonary effort is normal.     Breath sounds: Normal breath sounds. No wheezing or rales.  Musculoskeletal:     Right lower leg: No edema.     Left lower leg: No edema.          Assessment & Recommendations:   55 y/o Caucasian male with hypertension, paroxysmal Afib  Paroxysmal Afib: No recurrence. CHA2DS2VASc score 1, annual stroke risk 0.6%. No indication for Aspirin  Hypertension: Suspect component of white coat hypertension. Continue losartan to 50 mg daily, metoprolol tartarate 50 mg bid  F/u in 1  year   Elder Negus, MD [facility] Cardiovascular. [place] Pager: [phone] Office: [phone]

## 2022-02-19 ENCOUNTER — Ambulatory Visit: Payer: Commercial Managed Care - HMO | Admitting: Student

## 2022-02-19 ENCOUNTER — Ambulatory Visit: Payer: Self-pay | Admitting: Cardiology

## 2022-02-20 ENCOUNTER — Ambulatory Visit: Payer: Commercial Managed Care - HMO | Admitting: Cardiology

## 2022-02-20 NOTE — Progress Notes (Deleted)
Follow up visit  Subjective:   Christian Rivas, male    DOB: 1966-01-12, 56 y.o.   MRN: [id number]   HPI   No chief complaint on file.  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  He denies chest pain, shortness of breath, palpitations, leg edema, orthopnea, PND, TIA/syncope. Blood pressure is elevated.  ***His Afib was in 2018, and has not had any recurrence since then. BP is lower at home, always elevated at doctor's office.   Current Outpatient Medications:    Ascorbic Acid (VITAMIN C) 1000 MG tablet, Take 1,000 mg by mouth daily., Disp: , Rfl:    FOLIC ACID PO, Take 1 tablet by mouth daily., Disp: , Rfl:    losartan (COZAAR) 50 MG tablet, Take 1 tablet (50 mg total) by mouth daily., Disp: 90 tablet, Rfl: 3   metoprolol tartrate (LOPRESSOR) 50 MG tablet, Take 1 tablet (50 mg total) by mouth 2 (two) times daily., Disp: 180 tablet, Rfl: 1   Multiple Vitamin (MULTIVITAMIN) tablet, Take 1 tablet by mouth daily., Disp: , Rfl:    VITAMIN D PO, Take 1 tablet by mouth daily., Disp: , Rfl:    VITAMIN E COMPLEX PO, Take 1 tablet by mouth daily., Disp: , Rfl:   Cardiovascular & other pertient studies:  EKG 02/19/2021: Sinus rhythm 70 bpm  Normal EKG  Echocardiogram 02/11/2017: Left ventricle cavity is normal in size. Mild concentric hypertrophy of the left ventricle. Normal global wall motion. Visual EF is 50-55%. Normal diastolic filling pattern, normal LAP. Trace mitral regurgitation. Trace to mild tricuspid regurgitation. No evidence of pulmonary hypertension. Trace pulmonic regurgitation. Essentially normal echocardiogram.   Exercise sestamibi stress test 02/05/2017: 1. The resting electrocardiogram demonstrated normal sinus rhythm, normal resting conduction, no resting arrhythmias and normal rest repolarization.  The stress electrocardiogram was normal.  Patient  exercised on Bruce protocol for 12:30 minutes and achieved 13.8 METS. Stress test terminated due to 102% MPHR achieved (Target HR >85%). Normal BP response. No A. Fibrillation. 2. Stress and rest SPECT images demonstrate homogeneous tracer distribution throughout the myocardium. Gated SPECT imaging reveals normal myocardial thickening and wall motion. The left ventricular ejection fraction was normal  visually but calculated at (47%).  Low risk stress test with excellent effort.   10 day event monitoring 01/27/2017-02/10/2017: Paroxysmal A. fib with RVR.  Wide-complex tachycardia.  Recent labs: None available    Review of Systems  Cardiovascular:  Negative for chest pain, dyspnea on exertion, leg swelling, palpitations and syncope.         There were no vitals filed for this visit.   There is no height or weight on file to calculate BMI. There were no vitals filed for this visit.    Objective:   Physical Exam Vitals and nursing note reviewed.  Constitutional:      General: He is not in acute distress. Neck:     Vascular: No JVD.  Cardiovascular:     Rate and Rhythm: Normal rate and regular rhythm.     Pulses: Intact distal pulses.     Heart sounds: Normal heart sounds. No murmur heard. Pulmonary:     Effort: Pulmonary effort is normal.     Breath sounds: Normal breath sounds. No wheezing or rales.  Musculoskeletal:     Right lower leg: No edema.     Left lower leg: No edema.  Assessment & Recommendations:   56 y/o Caucasian male with hypertension, paroxysmal Afib  ***Paroxysmal Afib: No recurrence. CHA2DS2VASc score 1, annual stroke risk 0.6%. No indication for Aspirin  ***Hypertension: Suspect component of white coat hypertension. Continue losartan to 50 mg daily, metoprolol tartarate 50 mg bid  ***F/u in 1 year   Elder Negus, MD [facility]ovascular. [place] Pager: [phone] Office: [phone]

## 2022-02-24 ENCOUNTER — Other Ambulatory Visit: Payer: Self-pay | Admitting: Cardiology

## 2022-02-24 DIAGNOSIS — I480 Paroxysmal atrial fibrillation: Secondary | ICD-10-CM

## 2022-02-24 DIAGNOSIS — I10 Essential (primary) hypertension: Secondary | ICD-10-CM

## 2022-02-26 ENCOUNTER — Other Ambulatory Visit: Payer: Self-pay

## 2022-02-26 DIAGNOSIS — I10 Essential (primary) hypertension: Secondary | ICD-10-CM

## 2022-02-26 DIAGNOSIS — I480 Paroxysmal atrial fibrillation: Secondary | ICD-10-CM

## 2022-02-26 MED ORDER — LOSARTAN POTASSIUM 50 MG PO TABS: 50.0000 mg | ORAL_TABLET | Freq: Every day | ORAL | 0 refills | Status: DC

## 2022-03-22 NOTE — Progress Notes (Unsigned)
Follow up visit  Subjective:   Christian Rivas, male    DOB: 1966/08/05, 56 y.o.   MRN: [id number]   HPI   No chief complaint on file.  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  He denies chest pain, shortness of breath, palpitations, leg edema, orthopnea, PND, TIA/syncope. Blood pressure is elevated.  ***His Afib was in 2018, and has not had any recurrence since then. BP is lower at home, always elevated at doctor's office.   Current Outpatient Medications:    Ascorbic Acid (VITAMIN C) 1000 MG tablet, Take 1,000 mg by mouth daily., Disp: , Rfl:    FOLIC ACID PO, Take 1 tablet by mouth daily., Disp: , Rfl:    losartan (COZAAR) 50 MG tablet, Take 1 tablet (50 mg total) by mouth daily., Disp: 90 tablet, Rfl: 0   metoprolol tartrate (LOPRESSOR) 50 MG tablet, Take 1 tablet (50 mg total) by mouth 2 (two) times daily., Disp: 180 tablet, Rfl: 1   Multiple Vitamin (MULTIVITAMIN) tablet, Take 1 tablet by mouth daily., Disp: , Rfl:    VITAMIN D PO, Take 1 tablet by mouth daily., Disp: , Rfl:    VITAMIN E COMPLEX PO, Take 1 tablet by mouth daily., Disp: , Rfl:   Cardiovascular & other pertient studies:  EKG 02/19/2021: Sinus rhythm 70 bpm  Normal EKG  Echocardiogram 02/11/2017: Left ventricle cavity is normal in size. Mild concentric hypertrophy of the left ventricle. Normal global wall motion. Visual EF is 50-55%. Normal diastolic filling pattern, normal LAP. Trace mitral regurgitation. Trace to mild tricuspid regurgitation. No evidence of pulmonary hypertension. Trace pulmonic regurgitation. Essentially normal echocardiogram.   Exercise sestamibi stress test 02/05/2017: 1. The resting electrocardiogram demonstrated normal sinus rhythm, normal resting conduction, no resting arrhythmias and normal rest repolarization.  The stress electrocardiogram was normal.  Patient  exercised on Bruce protocol for 12:30 minutes and achieved 13.8 METS. Stress test terminated due to 102% MPHR achieved (Target HR >85%). Normal BP response. No A. Fibrillation. 2. Stress and rest SPECT images demonstrate homogeneous tracer distribution throughout the myocardium. Gated SPECT imaging reveals normal myocardial thickening and wall motion. The left ventricular ejection fraction was normal  visually but calculated at (47%).  Low risk stress test with excellent effort.   10 day event monitoring 01/27/2017-02/10/2017: Paroxysmal A. fib with RVR.  Wide-complex tachycardia.  Recent labs: None available    Review of Systems  Cardiovascular:  Negative for chest pain, dyspnea on exertion, leg swelling, palpitations and syncope.         There were no vitals filed for this visit.   There is no height or weight on file to calculate BMI. There were no vitals filed for this visit.    Objective:   Physical Exam Vitals and nursing note reviewed.  Constitutional:      General: He is not in acute distress. Neck:     Vascular: No JVD.  Cardiovascular:     Rate and Rhythm: Normal rate and regular rhythm.     Pulses: Intact distal pulses.     Heart sounds: Normal heart sounds. No murmur heard. Pulmonary:     Effort: Pulmonary effort is normal.     Breath sounds: Normal breath sounds. No wheezing or rales.  Musculoskeletal:     Right lower leg: No edema.     Left lower leg: No edema.  Assessment & Recommendations:   56 y/o Caucasian male with hypertension, paroxysmal Afib  ***Paroxysmal Afib: No recurrence. CHA2DS2VASc score 1, annual stroke risk 0.6%. No indication for Aspirin  ***Hypertension: Suspect component of white coat hypertension. Continue losartan to 50 mg daily, metoprolol tartarate 50 mg bid  ***F/u in 1 year   Elder Negus, MD [facility] Cardiovascular. [place] Pager: [phone] Office: [phone]

## 2022-03-23 ENCOUNTER — Encounter: Payer: Self-pay | Admitting: Cardiology

## 2022-03-23 ENCOUNTER — Ambulatory Visit: Payer: Commercial Managed Care - HMO | Admitting: Cardiology

## 2022-03-23 VITALS — BP 129/87 | HR 60 | Temp 98.0°F | Resp 16 | Ht 69.0 in | Wt 176.0 lb

## 2022-03-23 DIAGNOSIS — I480 Paroxysmal atrial fibrillation: Secondary | ICD-10-CM

## 2022-03-23 DIAGNOSIS — I10 Essential (primary) hypertension: Secondary | ICD-10-CM

## 2022-03-23 MED ORDER — METOPROLOL SUCCINATE ER 50 MG PO TB24: 50.0000 mg | ORAL_TABLET | Freq: Every day | ORAL | 3 refills | Status: AC

## 2022-07-08 ENCOUNTER — Other Ambulatory Visit: Payer: Self-pay | Admitting: Cardiology

## 2022-07-08 DIAGNOSIS — I480 Paroxysmal atrial fibrillation: Secondary | ICD-10-CM

## 2022-07-08 DIAGNOSIS — I10 Essential (primary) hypertension: Secondary | ICD-10-CM

## 2023-03-24 ENCOUNTER — Ambulatory Visit: Payer: Self-pay | Admitting: Cardiology

## 2023-03-29 ENCOUNTER — Ambulatory Visit: Payer: BC Managed Care – PPO | Admitting: Cardiology

## 2023-03-29 ENCOUNTER — Encounter: Payer: Self-pay | Admitting: Cardiology

## 2023-03-29 VITALS — BP 132/82 | HR 65 | Resp 16 | Ht 69.0 in | Wt 173.0 lb

## 2023-03-29 DIAGNOSIS — I480 Paroxysmal atrial fibrillation: Secondary | ICD-10-CM

## 2023-03-29 DIAGNOSIS — E782 Mixed hyperlipidemia: Secondary | ICD-10-CM | POA: Insufficient documentation

## 2023-03-29 DIAGNOSIS — I10 Essential (primary) hypertension: Secondary | ICD-10-CM

## 2023-03-29 NOTE — Progress Notes (Signed)
Follow up visit  Subjective:   Christian Rivas, male    DOB: 1966-05-25, 57 y.o.   MRN: [id number]   HPI   Chief Complaint  Patient presents with   Atrial Fibrillation   Follow-up    1 year   57 y/o Caucasian male with hypertension, hyperlipidemia, paroxysmal Afib  Patient is doing well. He had one episode of palpitations in 10/2021, but went away after taking metoprolol.  He is working and exercising regularly without any complaints.  Lipid panel performed by PCP recently, showed LDL of 140.  He was started on pravastatin.  Repeat lipid panel is pending.  He is taking aspirin 81 mg daily without any bleeding issues.   Current Outpatient Medications:    Ascorbic Acid (VITAMIN C) 1000 MG tablet, Take 1,000 mg by mouth daily., Disp: , Rfl:    aspirin EC 81 MG tablet, Take 81 mg by mouth daily. Swallow whole., Disp: , Rfl:    FOLIC ACID PO, Take 1 tablet by mouth daily., Disp: , Rfl:    losartan (COZAAR) 50 MG tablet, TAKE 1 TABLET BY MOUTH EVERY DAY, Disp: 90 tablet, Rfl: 2   metoprolol succinate (TOPROL-XL) 50 MG 24 hr tablet, Take 1 tablet (50 mg total) by mouth daily. Take with or immediately following a meal., Disp: 90 tablet, Rfl: 3   Multiple Vitamin (MULTIVITAMIN) tablet, Take 1 tablet by mouth daily., Disp: , Rfl:    pravastatin (PRAVACHOL) 20 MG tablet, Take 20 mg by mouth daily., Disp: , Rfl:    VITAMIN D PO, Take 1 tablet by mouth daily., Disp: , Rfl:    VITAMIN E COMPLEX PO, Take 1 tablet by mouth daily., Disp: , Rfl:   Cardiovascular & other pertient studies:  EKG 03/29/2023: Sinus rhythm 70 bpm Normal EKG  Echocardiogram 2018: Left ventricle cavity is normal in size. Mild concentric hypertrophy of the left ventricle. Normal global wall motion. Visual EF is 50-55%. Normal diastolic filling pattern, normal LAP. Trace mitral regurgitation. Trace to mild tricuspid regurgitation. No evidence of pulmonary hypertension. Trace pulmonic regurgitation. Essentially normal  echocardiogram.   Exercise sestamibi stress test 2018: 1. The resting electrocardiogram demonstrated normal sinus rhythm, normal resting conduction, no resting arrhythmias and normal rest repolarization.  The stress electrocardiogram was normal.  Patient exercised on Bruce protocol for 12:30 minutes and achieved 13.8 METS. Stress test terminated due to 102% MPHR achieved (Target HR >85%). Normal BP response. No A. Fibrillation. 2. Stress and rest SPECT images demonstrate homogeneous tracer distribution throughout the myocardium. Gated SPECT imaging reveals normal myocardial thickening and wall motion. The left ventricular ejection fraction was normal  visually but calculated at (47%).  Low risk stress test with excellent effort.   10 day event monitoring 01/27/2017-02/10/2017: Paroxysmal A. fib with RVR.  Wide-complex tachycardia.  Recent labs: April-July 2024: Glucose 65, BUN/Cr 18/88. EGFR 101. Na/K 142/4.5. Rest of the CMP normal H/H 15/46. MCV 90. Platelets 244 HbA1C NA Chol 231, TG 134, HDL 55, LDL 152 TSH NA     Review of Systems  Cardiovascular:  Negative for chest pain, dyspnea on exertion, leg swelling, palpitations and syncope.         Vitals:   03/29/23 0907  BP: 132/82  Pulse: 65  Resp: 16  SpO2: 96%     Body mass index is 25.55 kg/m. Filed Weights   03/29/23 0907  Weight: 173 lb (78.5 kg)      Objective:   Physical Exam Vitals and nursing note reviewed.  Constitutional:      General: He is not in acute distress. Neck:     Vascular: No JVD.  Cardiovascular:     Rate and Rhythm: Normal rate and regular rhythm.     Pulses: Intact distal pulses.     Heart sounds: Normal heart sounds. No murmur heard. Pulmonary:     Effort: Pulmonary effort is normal.     Breath sounds: Normal breath sounds. No wheezing or rales.  Musculoskeletal:     Right lower leg: No edema.     Left lower leg: No edema.           Assessment & Recommendations:   57 y/o  Caucasian male with hypertension, hyperlipidemia, paroxysmal Afib  Paroxysmal Afib: No recurrence. CHA2DS2VASc score 1, annual stroke risk 0.6%. He has been on aspirin without any bleeding issues.  Reasonable to continue.  Hypertension: Controlled.  Mixed hyperlipidemia: Chol 231, TG 134, HDL 55, LDL 152 (11/2022) Started on pravastatin a month ago.  Repeat lipid panel pending. Also, will obtain CT cardiac scoring scan for risk stratification.  F/u in 1 year   Elder Negus, MD [facility] Cardiovascular. [place] Pager: [phone] Office: [phone]

## 2023-04-07 NOTE — Telephone Encounter (Signed)
done

## 2023-04-13 ENCOUNTER — Ambulatory Visit (HOSPITAL_COMMUNITY)
Admission: RE | Admit: 2023-04-13 | Discharge: 2023-04-13 | Disposition: A | Discharge status: Home / Self Care | Payer: BC Managed Care – PPO | Source: Ambulatory Visit | Attending: Cardiology | Admitting: Cardiology

## 2023-04-13 DIAGNOSIS — E782 Mixed hyperlipidemia: Secondary | ICD-10-CM | POA: Insufficient documentation

## 2023-04-16 NOTE — Addendum Note (Signed)
Addended by: Elder Negus on: 04/16/2023 09:08 PM   Modules accepted: Orders

## 2023-04-20 NOTE — Progress Notes (Signed)
Called patient, No answer ( LVM)

## 2023-04-21 NOTE — Progress Notes (Signed)
Called pt LVM 2nd attempt

## 2023-04-22 ENCOUNTER — Other Ambulatory Visit: Payer: Self-pay

## 2023-04-22 ENCOUNTER — Other Ambulatory Visit: Payer: Self-pay | Admitting: Cardiology

## 2023-04-22 DIAGNOSIS — E782 Mixed hyperlipidemia: Secondary | ICD-10-CM

## 2023-04-22 MED ORDER — ROSUVASTATIN CALCIUM 20 MG PO TABS
20.0000 mg | ORAL_TABLET | Freq: Every day | ORAL | 3 refills | Status: DC
Start: 2023-04-22 — End: 2024-06-14

## 2023-04-22 NOTE — Progress Notes (Signed)
Called and spoke to patient he voiced understanding. Patient is open to trying rosuvastatin but patient wanted to make you aware since starting pravastatin his tendinitis has flared up patient has needed to go to massage therapy due to it patient stated he will try the new medication but will contact us if he starts having any ache

## 2023-04-22 NOTE — Progress Notes (Signed)
Thank you for letting me know. Lets continue pravastatin for now, maybe every other day. Will repeat lipid panel in 3 months and f/u after that. If does not tolerate pravastatin, may need to discuss other options.  Thanks MJP

## 2023-04-23 NOTE — Progress Notes (Signed)
Called patient no answer left a detailed message from the message above

## 2024-03-29 ENCOUNTER — Ambulatory Visit: Payer: Self-pay | Admitting: Cardiology

## 2024-06-11 ENCOUNTER — Other Ambulatory Visit: Payer: Self-pay | Admitting: Cardiology

## 2024-07-16 ENCOUNTER — Other Ambulatory Visit: Payer: Self-pay | Admitting: Cardiology

## 2024-10-27 ENCOUNTER — Ambulatory Visit: Admitting: Cardiology
